# Patient Record
Sex: Male | Born: 1945 | Race: White | Hispanic: Refuse to answer | Marital: Married | Smoking: Current some day smoker
Health system: Southern US, Community
[De-identification: ages and names within clinical notes are randomized; demographics above are authoritative.]

---

## 2005-03-26 ENCOUNTER — Ambulatory Visit: Payer: Self-pay | Admitting: Physician Assistant

## 2005-04-14 ENCOUNTER — Other Ambulatory Visit: Payer: Self-pay

## 2005-04-16 ENCOUNTER — Ambulatory Visit: Payer: Self-pay | Admitting: Unknown Physician Specialty

## 2005-12-24 ENCOUNTER — Ambulatory Visit: Payer: Self-pay | Admitting: Urology

## 2006-02-17 ENCOUNTER — Ambulatory Visit: Payer: Self-pay | Admitting: Urology

## 2006-03-04 ENCOUNTER — Ambulatory Visit: Payer: Self-pay | Admitting: Gastroenterology

## 2007-02-02 ENCOUNTER — Ambulatory Visit: Payer: Self-pay | Admitting: Urology

## 2008-12-20 ENCOUNTER — Inpatient Hospital Stay: Payer: Self-pay | Admitting: Internal Medicine

## 2008-12-23 ENCOUNTER — Inpatient Hospital Stay: Payer: Self-pay | Admitting: Vascular Surgery

## 2009-01-09 ENCOUNTER — Encounter: Payer: Self-pay | Admitting: Internal Medicine

## 2009-02-04 ENCOUNTER — Ambulatory Visit: Payer: Self-pay | Admitting: Family Medicine

## 2009-02-12 ENCOUNTER — Inpatient Hospital Stay: Payer: Self-pay | Admitting: Surgery

## 2009-02-28 ENCOUNTER — Other Ambulatory Visit: Payer: Self-pay | Admitting: Internal Medicine

## 2009-03-05 ENCOUNTER — Inpatient Hospital Stay: Payer: Self-pay | Admitting: Internal Medicine

## 2009-04-17 ENCOUNTER — Ambulatory Visit: Payer: Self-pay | Admitting: Vascular Surgery

## 2009-04-18 ENCOUNTER — Ambulatory Visit: Payer: Self-pay | Admitting: Vascular Surgery

## 2009-04-26 ENCOUNTER — Ambulatory Visit: Payer: Self-pay | Admitting: Vascular Surgery

## 2009-05-07 ENCOUNTER — Ambulatory Visit: Payer: Self-pay | Admitting: Vascular Surgery

## 2009-05-15 ENCOUNTER — Inpatient Hospital Stay: Payer: Self-pay | Admitting: Vascular Surgery

## 2010-07-07 ENCOUNTER — Inpatient Hospital Stay: Payer: Self-pay | Admitting: Internal Medicine

## 2010-07-10 ENCOUNTER — Inpatient Hospital Stay: Payer: Self-pay | Admitting: Vascular Surgery

## 2010-10-09 ENCOUNTER — Ambulatory Visit: Payer: Self-pay

## 2010-10-13 ENCOUNTER — Other Ambulatory Visit: Payer: Self-pay | Admitting: Surgery

## 2010-10-16 ENCOUNTER — Inpatient Hospital Stay: Payer: Self-pay | Admitting: Surgery

## 2012-06-10 IMAGING — CR DG CHEST 2V
1 series · 2 of 2 positions shown · non-contrast
Comparison: none

REASON FOR EXAM: fever and cough
COMMENTS:

[Series 1: view not recorded · 0.17mm/px · 2 of 2 slices shown]
[im 1/2]
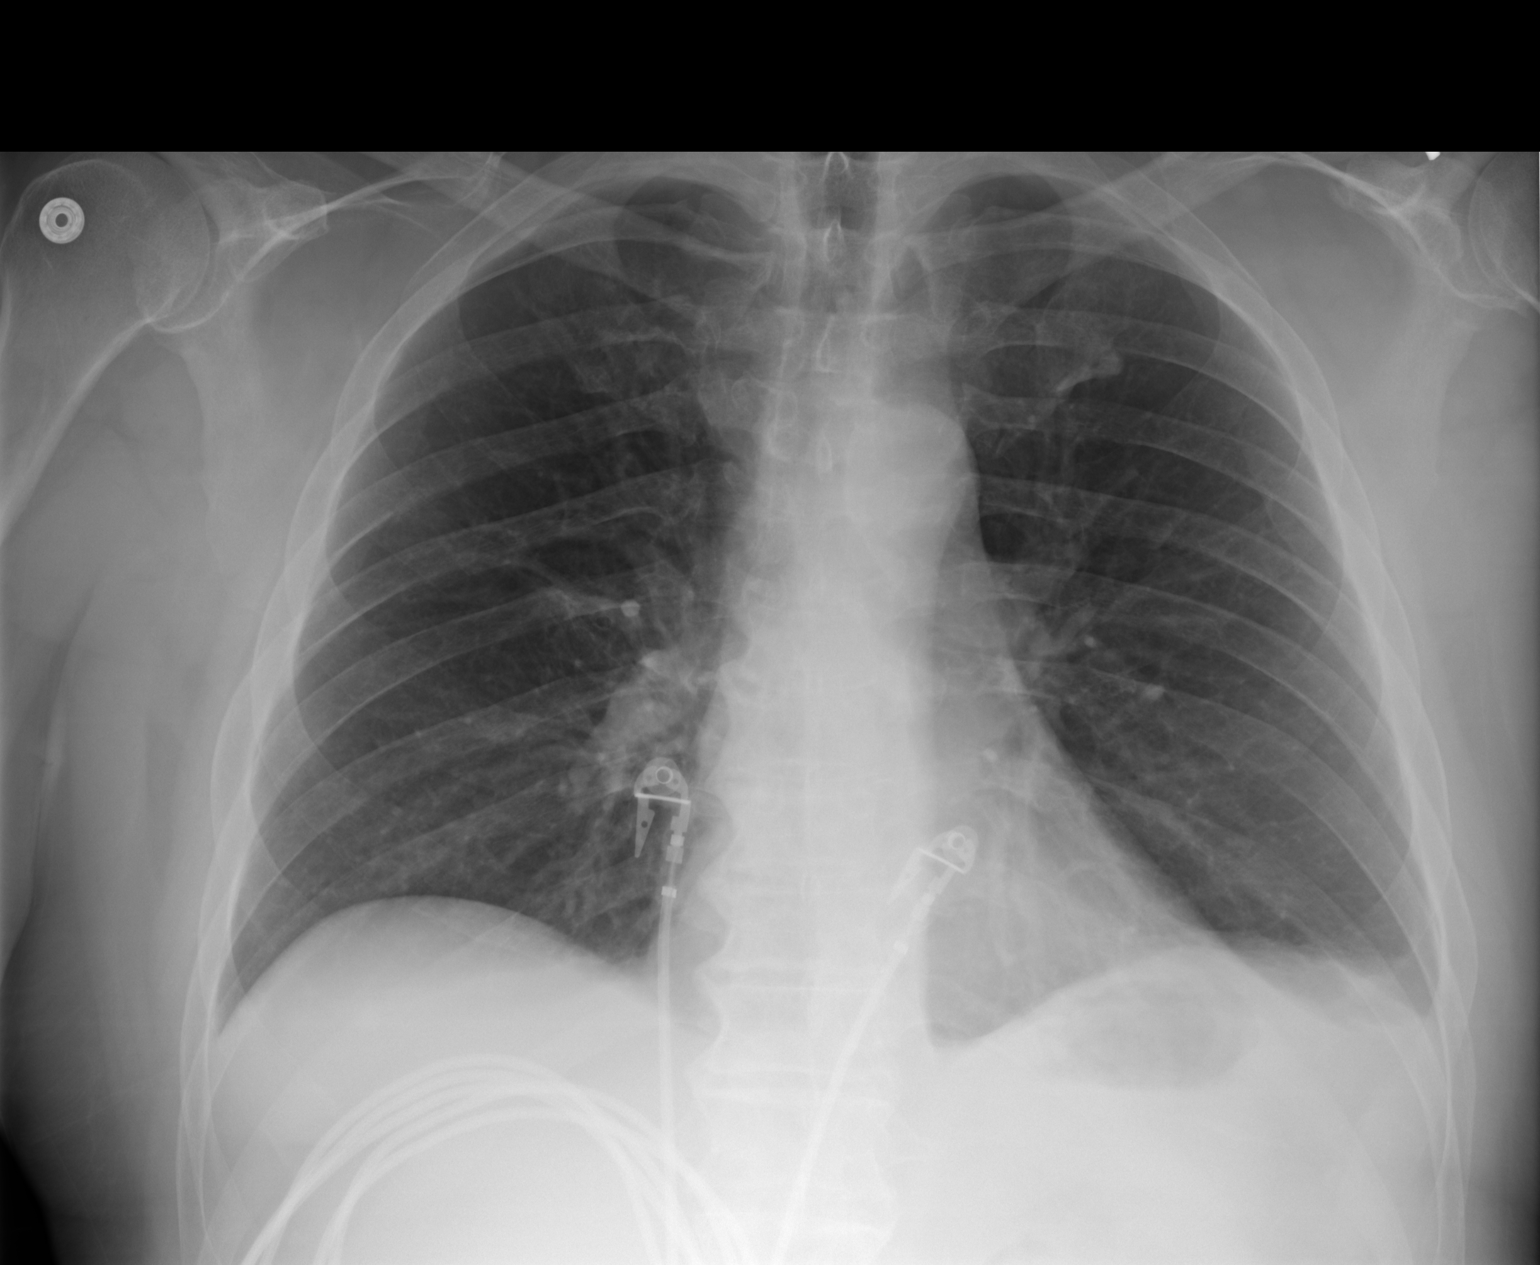
[im 2/2]
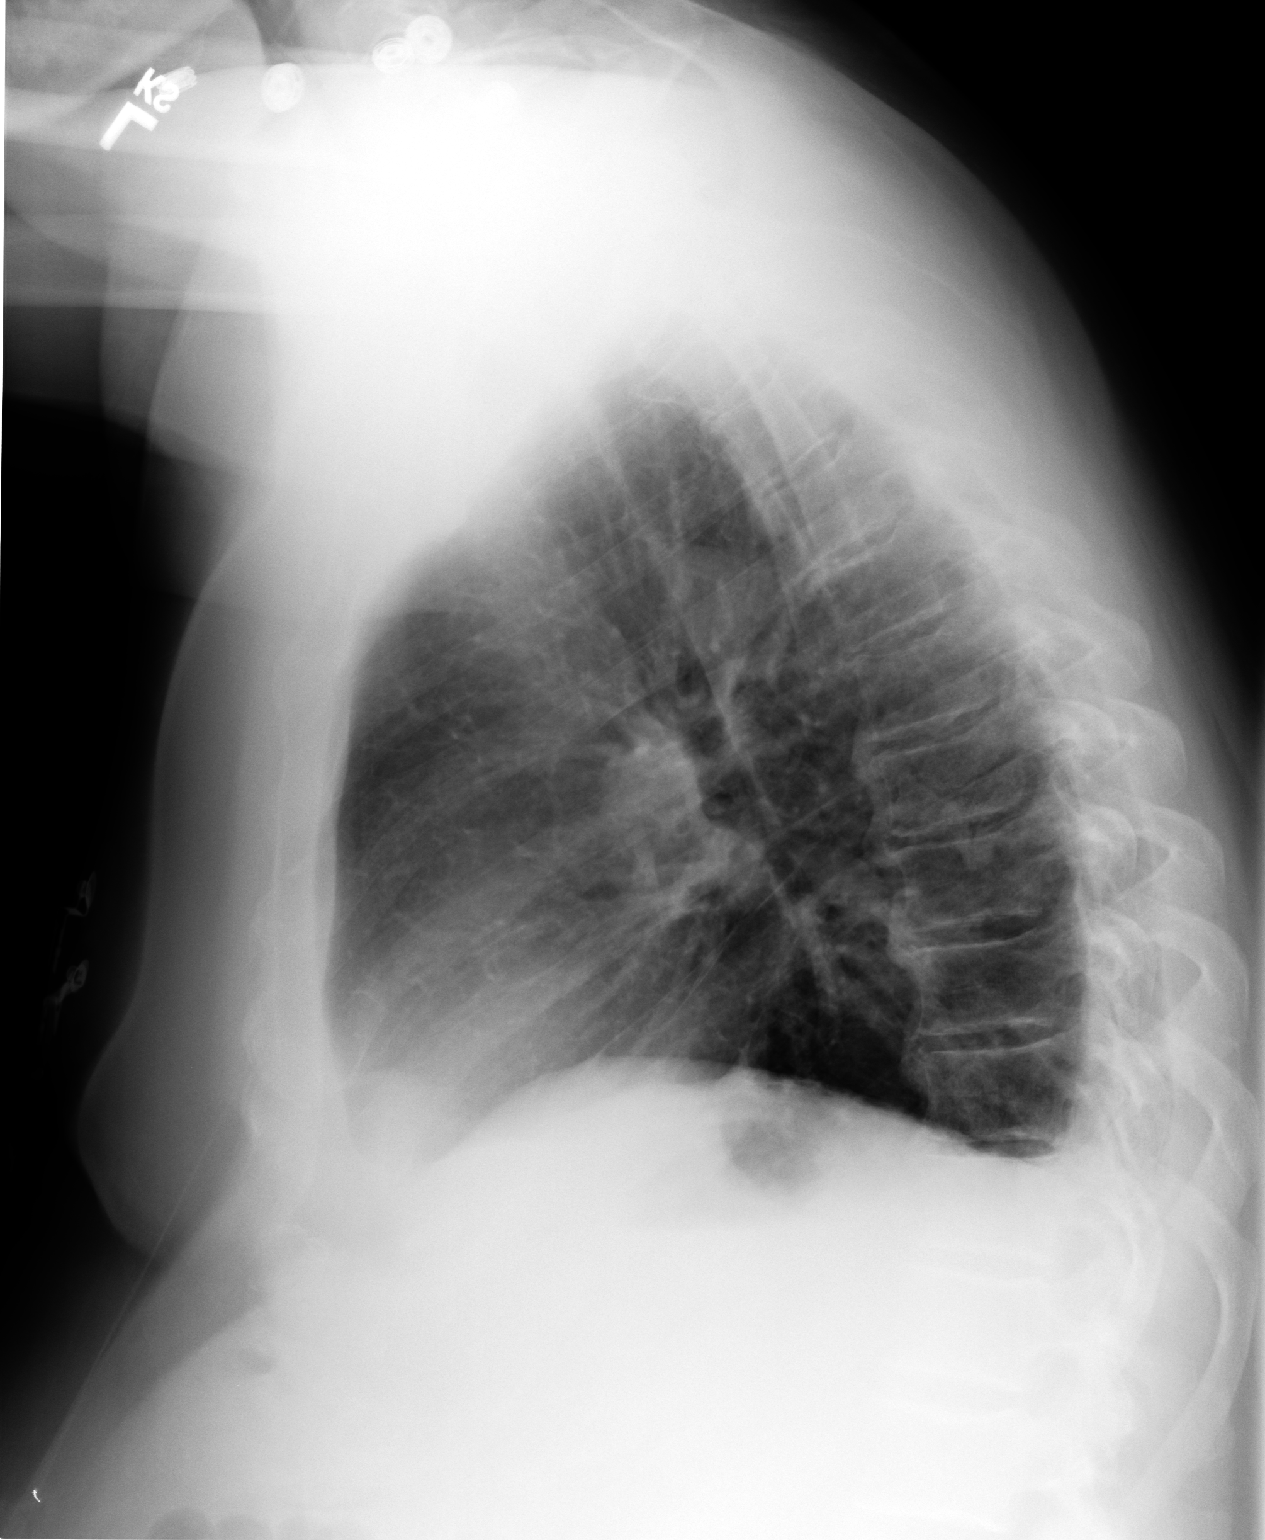

[2 of 2 positions shown; findings below may reference images not displayed]

PROCEDURE:     DXR - DXR CHEST PA (OR AP) AND LATERAL  - July 13, 2010 [DATE]

RESULT:     Comparison is made to a prior study dated 07/10/2010.

An ill-defined area of increased density projects within the right lower
lobe region. No focal regions of consolidation are identified. There is
blunting of the left costophrenic angle. The cardiac silhouette and
visualized bony skeleton are unremarkable.
IMPRESSION: 1.     Atelectasis versus infiltrate, right lower lobe.
2.     Small, left effusion.

## 2022-06-27 ENCOUNTER — Emergency Department (HOSPITAL_COMMUNITY): Payer: Medicare PPO

## 2022-06-27 ENCOUNTER — Other Ambulatory Visit: Payer: Self-pay

## 2022-06-27 ENCOUNTER — Inpatient Hospital Stay (HOSPITAL_COMMUNITY)
Admission: EM | Admit: 2022-06-27 | Discharge: 2022-06-30 | DRG: 871 | Disposition: A | Discharge status: Home / Self Care | Payer: Medicare PPO | Attending: Internal Medicine | Admitting: Internal Medicine

## 2022-06-27 ENCOUNTER — Encounter (HOSPITAL_COMMUNITY): Payer: Self-pay

## 2022-06-27 DIAGNOSIS — T380X5A Adverse effect of glucocorticoids and synthetic analogues, initial encounter: Secondary | ICD-10-CM | POA: Diagnosis present

## 2022-06-27 DIAGNOSIS — F17210 Nicotine dependence, cigarettes, uncomplicated: Secondary | ICD-10-CM | POA: Diagnosis present

## 2022-06-27 DIAGNOSIS — A419 Sepsis, unspecified organism: Principal | ICD-10-CM | POA: Diagnosis present

## 2022-06-27 DIAGNOSIS — I10 Essential (primary) hypertension: Secondary | ICD-10-CM | POA: Diagnosis present

## 2022-06-27 DIAGNOSIS — I5042 Chronic combined systolic (congestive) and diastolic (congestive) heart failure: Secondary | ICD-10-CM | POA: Diagnosis present

## 2022-06-27 DIAGNOSIS — Z20822 Contact with and (suspected) exposure to covid-19: Secondary | ICD-10-CM | POA: Diagnosis present

## 2022-06-27 DIAGNOSIS — N1832 Chronic kidney disease, stage 3b: Secondary | ICD-10-CM | POA: Diagnosis present

## 2022-06-27 DIAGNOSIS — J189 Pneumonia, unspecified organism: Secondary | ICD-10-CM | POA: Diagnosis present

## 2022-06-27 DIAGNOSIS — Z955 Presence of coronary angioplasty implant and graft: Secondary | ICD-10-CM

## 2022-06-27 DIAGNOSIS — R7989 Other specified abnormal findings of blood chemistry: Secondary | ICD-10-CM | POA: Diagnosis present

## 2022-06-27 DIAGNOSIS — I480 Paroxysmal atrial fibrillation: Secondary | ICD-10-CM | POA: Diagnosis present

## 2022-06-27 DIAGNOSIS — E118 Type 2 diabetes mellitus with unspecified complications: Secondary | ICD-10-CM | POA: Diagnosis present

## 2022-06-27 DIAGNOSIS — Z720 Tobacco use: Secondary | ICD-10-CM | POA: Diagnosis present

## 2022-06-27 DIAGNOSIS — R6520 Severe sepsis without septic shock: Secondary | ICD-10-CM | POA: Diagnosis present

## 2022-06-27 DIAGNOSIS — E1122 Type 2 diabetes mellitus with diabetic chronic kidney disease: Secondary | ICD-10-CM | POA: Diagnosis present

## 2022-06-27 DIAGNOSIS — I130 Hypertensive heart and chronic kidney disease with heart failure and stage 1 through stage 4 chronic kidney disease, or unspecified chronic kidney disease: Secondary | ICD-10-CM | POA: Diagnosis present

## 2022-06-27 DIAGNOSIS — J9601 Acute respiratory failure with hypoxia: Secondary | ICD-10-CM | POA: Diagnosis not present

## 2022-06-27 DIAGNOSIS — J440 Chronic obstructive pulmonary disease with acute lower respiratory infection: Secondary | ICD-10-CM | POA: Diagnosis present

## 2022-06-27 DIAGNOSIS — J441 Chronic obstructive pulmonary disease with (acute) exacerbation: Secondary | ICD-10-CM | POA: Diagnosis present

## 2022-06-27 DIAGNOSIS — N183 Chronic kidney disease, stage 3 unspecified: Secondary | ICD-10-CM | POA: Diagnosis present

## 2022-06-27 DIAGNOSIS — I2489 Other forms of acute ischemic heart disease: Secondary | ICD-10-CM | POA: Diagnosis present

## 2022-06-27 DIAGNOSIS — E785 Hyperlipidemia, unspecified: Secondary | ICD-10-CM | POA: Diagnosis present

## 2022-06-27 DIAGNOSIS — I252 Old myocardial infarction: Secondary | ICD-10-CM

## 2022-06-27 DIAGNOSIS — E1165 Type 2 diabetes mellitus with hyperglycemia: Secondary | ICD-10-CM | POA: Diagnosis present

## 2022-06-27 DIAGNOSIS — G4733 Obstructive sleep apnea (adult) (pediatric): Secondary | ICD-10-CM | POA: Diagnosis present

## 2022-06-27 DIAGNOSIS — I493 Ventricular premature depolarization: Secondary | ICD-10-CM | POA: Diagnosis present

## 2022-06-27 DIAGNOSIS — I2510 Atherosclerotic heart disease of native coronary artery without angina pectoris: Secondary | ICD-10-CM | POA: Diagnosis present

## 2022-06-27 DIAGNOSIS — Z7901 Long term (current) use of anticoagulants: Secondary | ICD-10-CM

## 2022-06-27 LAB — LACTIC ACID, PLASMA: Lactic Acid, Venous: 1.7 mmol/L (ref 0.5–1.9)

## 2022-06-27 LAB — RESP PANEL BY RT-PCR (FLU A&B, COVID) ARPGX2
Influenza A by PCR: NEGATIVE
Influenza B by PCR: NEGATIVE
SARS Coronavirus 2 by RT PCR: NEGATIVE

## 2022-06-27 LAB — PROTIME-INR
INR: 1.7 — ABNORMAL HIGH (ref 0.8–1.2)
Prothrombin Time: 19.6 seconds — ABNORMAL HIGH (ref 11.4–15.2)

## 2022-06-27 LAB — CBC
HCT: 43.3 % (ref 39.0–52.0)
Hemoglobin: 14 g/dL (ref 13.0–17.0)
MCH: 32.8 pg (ref 26.0–34.0)
MCHC: 32.3 g/dL (ref 30.0–36.0)
MCV: 101.4 fL — ABNORMAL HIGH (ref 80.0–100.0)
Platelets: 249 10*3/uL (ref 150–400)
RBC: 4.27 MIL/uL (ref 4.22–5.81)
RDW: 12.5 % (ref 11.5–15.5)
WBC: 13.1 10*3/uL — ABNORMAL HIGH (ref 4.0–10.5)
nRBC: 0 % (ref 0.0–0.2)

## 2022-06-27 LAB — BASIC METABOLIC PANEL
Anion gap: 12 (ref 5–15)
BUN: 36 mg/dL — ABNORMAL HIGH (ref 8–23)
CO2: 23 mmol/L (ref 22–32)
Calcium: 9 mg/dL (ref 8.9–10.3)
Chloride: 106 mmol/L (ref 98–111)
Creatinine, Ser: 1.75 mg/dL — ABNORMAL HIGH (ref 0.61–1.24)
GFR, Estimated: 40 mL/min — ABNORMAL LOW (ref >60)
Glucose, Bld: 349 mg/dL — ABNORMAL HIGH (ref 70–99)
Potassium: 4.9 mmol/L (ref 3.5–5.1)
Sodium: 141 mmol/L (ref 135–145)

## 2022-06-27 LAB — TROPONIN I (HIGH SENSITIVITY): Troponin I (High Sensitivity): 390 ng/L (ref <18)

## 2022-06-27 LAB — BRAIN NATRIURETIC PEPTIDE: B Natriuretic Peptide: 194.9 pg/mL — ABNORMAL HIGH (ref 0.0–100.0)

## 2022-06-27 MED ORDER — SODIUM CHLORIDE 0.9 % IV SOLN
500.0000 mg | Freq: Once | INTRAVENOUS | Status: AC
  Administered 2022-06-28: 500 mg via INTRAVENOUS
  Filled 2022-06-27: qty 5

## 2022-06-27 MED ORDER — IPRATROPIUM-ALBUTEROL 0.5-2.5 (3) MG/3ML IN SOLN
3.0000 mL | Freq: Once | RESPIRATORY_TRACT | Status: AC
  Administered 2022-06-27: 3 mL via RESPIRATORY_TRACT
  Filled 2022-06-27: qty 3

## 2022-06-27 MED ORDER — IOHEXOL 350 MG/ML SOLN
75.0000 mL | Freq: Once | INTRAVENOUS | Status: AC | PRN
  Administered 2022-06-27: 75 mL via INTRAVENOUS

## 2022-06-27 MED ORDER — METHYLPREDNISOLONE SODIUM SUCC 125 MG IJ SOLR
125.0000 mg | Freq: Once | INTRAMUSCULAR | Status: AC
  Administered 2022-06-27: 125 mg via INTRAVENOUS
  Filled 2022-06-27: qty 2

## 2022-06-27 MED ORDER — SODIUM CHLORIDE 0.9 % IV SOLN
1.0000 g | Freq: Once | INTRAVENOUS | Status: AC
  Administered 2022-06-27: 1 g via INTRAVENOUS
  Filled 2022-06-27: qty 10

## 2022-06-27 MED ORDER — ALBUTEROL SULFATE HFA 108 (90 BASE) MCG/ACT IN AERS: 2.0000 | INHALATION_SPRAY | RESPIRATORY_TRACT | Status: DC | PRN

## 2022-06-27 MED ORDER — ACETAMINOPHEN 500 MG PO TABS
1000.0000 mg | ORAL_TABLET | Freq: Once | ORAL | Status: AC
  Administered 2022-06-27: 1000 mg via ORAL
  Filled 2022-06-27: qty 2

## 2022-06-27 NOTE — ED Triage Notes (Addendum)
Patient arrives with labored, shallow breaths. Patient states he is short of breath and has been for a few days. Patient has a history of COPD. Reports his wife has been sick. Patient is lives in [place] and is here for a golf tournament. Takes Eliquis for Afib.

## 2022-06-27 NOTE — ED Notes (Signed)
Pt sats 85. Pt placed on 2LNC. Provider notified

## 2022-06-27 NOTE — ED Provider Triage Note (Signed)
Emergency Medicine Provider Triage Evaluation Note  Jeff White , a 76 y.o. male  was evaluated in triage.  Pt complains of breath for 4 days.  He had associated productive cough and fever.  He has been using his home inhalers without relief.  His wife was recently sick with the same thing. He does not use oxygen at home. Hx of COPD. He lives in [place] and is here for a golf tournament. He is on Eliquis for his atrial fibrillation.  Review of Systems  Positive:  Negative:   Physical Exam  BP 105/63   Pulse (!) 106   Temp (!) 100.5 F (38.1 C) (Oral)   Resp (!) 25   Ht 5\' 10"  (1.778 m)   Wt 84.4 kg   SpO2 (!) 87%   BMI 26.69 kg/m  Gen:   Awake, no distress   Resp:  Normal effort  MSK:   Moves extremities without difficulty  Other:  Wheezing/course sounds on right. On 3 L O2.  Medical Decision Making  Medically screening exam initiated at 8:58 PM.  Appropriate orders placed.  Jeff White was informed that the remainder of the evaluation will be completed by another provider, this initial triage assessment does not replace that evaluation, and the importance of remaining in the ED until their evaluation is complete.     Adolphus Birchwood, [place] 06/27/22 2100

## 2022-06-27 NOTE — ED Provider Notes (Signed)
[facility] DEPT Provider Note   CSN: [id number] Arrival date & time: 06/27/22  2027     History PMH: COPD, atrial fibrillation on thinners, CAD, DM2, HLD, Tobacco use, HTN Chief Complaint  Patient presents with   Shortness of Breath    Jeff White is a 76 y.o. male.  Pt complains of breath for 4 days.  He had associated productive cough and fever.  He has been using his home inhalers without relief.  His wife was recently sick with the same thing. He does not use oxygen at home. Hx of COPD. He lives in [place] and is here for a golf tournament. He is on Eliquis for his atrial fibrillation.   Shortness of Breath Associated symptoms: cough, fever and wheezing        Home Medications Prior to Admission medications   Not on File      Allergies    Patient has no known allergies.    Review of Systems   Review of Systems  Constitutional:  Positive for fever.  Respiratory:  Positive for cough, shortness of breath and wheezing.   All other systems reviewed and are negative.   Physical Exam Updated Vital Signs BP 108/72   Pulse 72   Temp 98.5 F (36.9 C) (Oral)   Resp (!) 27   Ht 5\' 10"  (1.778 m)   Wt 84.4 kg   SpO2 100%   BMI 26.69 kg/m  Physical Exam Vitals and nursing note reviewed.  Constitutional:      General: He is not in acute distress.    Appearance: Normal appearance. He is not ill-appearing, toxic-appearing or diaphoretic.     Interventions: He is not intubated. HENT:     Head: Normocephalic and atraumatic.     Nose: No nasal deformity.     Mouth/Throat:     Lips: Pink. No lesions.     Mouth: Mucous membranes are moist. No injury, lacerations, oral lesions or angioedema.     Pharynx: Oropharynx is clear. Uvula midline. No pharyngeal swelling, oropharyngeal exudate, posterior oropharyngeal erythema or uvula swelling.  Eyes:     General: Gaze aligned appropriately. No scleral icterus.       Right eye: No  discharge.        Left eye: No discharge.     Conjunctiva/sclera: Conjunctivae normal.     Right eye: Right conjunctiva is not injected. No exudate or hemorrhage.    Left eye: Left conjunctiva is not injected. No exudate or hemorrhage.    Pupils: Pupils are equal, round, and reactive to light.  Neck:     Vascular: No JVD.     Trachea: No tracheal deviation.  Cardiovascular:     Rate and Rhythm: Normal rate and regular rhythm.     Pulses: Normal pulses.          Radial pulses are 2+ on the right side and 2+ on the left side.       Dorsalis pedis pulses are 2+ on the right side and 2+ on the left side.     Heart sounds: Normal heart sounds, S1 normal and S2 normal. Heart sounds not distant. No murmur heard.    No friction rub. No gallop. No S3 or S4 sounds.  Pulmonary:     Effort: Pulmonary effort is normal. Tachypnea present. No accessory muscle usage or respiratory distress. He is not intubated.     Breath sounds: No stridor. Examination of the right-upper field reveals wheezing and  rhonchi. Examination of the left-upper field reveals wheezing. Examination of the right-middle field reveals wheezing and rhonchi. Examination of the left-middle field reveals wheezing. Examination of the right-lower field reveals wheezing and rhonchi. Examination of the left-lower field reveals wheezing. Wheezing and rhonchi present. No rales.  Chest:     Chest wall: No tenderness.  Abdominal:     General: Abdomen is flat. Bowel sounds are normal. There is no distension.     Palpations: Abdomen is soft. There is no mass or pulsatile mass.     Tenderness: There is no abdominal tenderness. There is no guarding or rebound.  Musculoskeletal:     Right lower leg: No tenderness. No edema.     Left lower leg: No tenderness. No edema.  Skin:    General: Skin is warm and dry.     Coloration: Skin is not jaundiced or pale.     Findings: No bruising, erythema, lesion or rash.  Neurological:     General: No focal  deficit present.     Mental Status: He is alert and oriented to person, place, and time.     GCS: GCS eye subscore is 4. GCS verbal subscore is 5. GCS motor subscore is 6.  Psychiatric:        Mood and Affect: Mood normal.        Behavior: Behavior normal. Behavior is cooperative.     ED Results / Procedures / Treatments   Labs (all labs ordered are listed, but only abnormal results are displayed) Labs Reviewed  BASIC METABOLIC PANEL - Abnormal; Notable for the following components:      Result Value   Glucose, Bld 349 (*)    BUN 36 (*)    Creatinine, Ser 1.75 (*)    GFR, Estimated 40 (*)    All other components within normal limits  CBC - Abnormal; Notable for the following components:   WBC 13.1 (*)    MCV 101.4 (*)    All other components within normal limits  PROTIME-INR - Abnormal; Notable for the following components:   Prothrombin Time 19.6 (*)    INR 1.7 (*)    All other components within normal limits  BRAIN NATRIURETIC PEPTIDE - Abnormal; Notable for the following components:   B Natriuretic Peptide 194.9 (*)    All other components within normal limits  TROPONIN I (HIGH SENSITIVITY) - Abnormal; Notable for the following components:   Troponin I (High Sensitivity) 390 (*)    All other components within normal limits  RESP PANEL BY RT-PCR (FLU A&B, COVID) ARPGX2  CULTURE, BLOOD (ROUTINE X 2)  CULTURE, BLOOD (ROUTINE X 2)  LACTIC ACID, PLASMA  LACTIC ACID, PLASMA  URINALYSIS, ROUTINE W REFLEX MICROSCOPIC  TROPONIN I (HIGH SENSITIVITY)    EKG EKG Interpretation  Date/Time:  Saturday June 27 2022 22:05:10 EDT Ventricular Rate:  89 PR Interval:  155 QRS Duration: 89 QT Interval:  362 QTC Calculation: 441 R Axis:   69 Text Interpretation: Sinus rhythm Atrial premature complexes Confirmed by Dene Gentry [phone]) on 06/27/2022 10:21:17 PM  Radiology CT Angio Chest PE W and/or Wo Contrast  Result Date: 06/27/2022 CLINICAL DATA:  Shortness of breath for  several days with fevers EXAM: CT ANGIOGRAPHY CHEST WITH CONTRAST TECHNIQUE: Multidetector CT imaging of the chest was performed using the standard protocol during bolus administration of intravenous contrast. Multiplanar CT image reconstructions and MIPs were obtained to evaluate the vascular anatomy. RADIATION DOSE REDUCTION: This exam was performed according to the departmental  dose-optimization program which includes automated exposure control, adjustment of the mA and/or kV according to patient size and/or use of iterative reconstruction technique. CONTRAST:  18mL OMNIPAQUE IOHEXOL 350 MG/ML SOLN COMPARISON:  Chest x-ray from earlier in the same day. FINDINGS: Cardiovascular: Thoracic aorta shows atherosclerotic calcifications. No aneurysmal dilatation or dissection is noted. No cardiac enlargement is seen. Coronary calcifications are noted. The pulmonary artery shows a normal branching pattern bilaterally. No filling defect to suggest pulmonary embolism is noted. Mediastinum/Nodes: Thoracic inlet is within normal limits. No hilar or mediastinal adenopathy is noted. Scattered small mediastinal nodes are seen likely reactive in nature. The esophagus is within normal limits. Lungs/Pleura: Somewhat limited by patient motion artifact. Diffuse tree-in-bud nodularity is noted throughout both lungs consistent with inflammatory change. No sizable effusion is noted. No sizable parenchymal nodules are seen. Upper Abdomen: Upper pole left renal cyst is noted which appears simple in nature. No further follow-up is recommended. Musculoskeletal: Degenerative changes of the thoracic spine are noted. Review of the MIP images confirms the above findings. IMPRESSION: Diffuse changes throughout both lungs consistent with underlying inflammatory change likely atypical pneumonia. No evidence of pulmonary emboli. Aortic Atherosclerosis (ICD10-I70.0). Electronically Signed   By: Inez Catalina M.D.   On: 06/27/2022 23:47   DG Chest 2  View  Result Date: 06/27/2022 CLINICAL DATA:  Shortness of breath EXAM: CHEST - 2 VIEW COMPARISON:  Radiographs 10/18/2010 FINDINGS: Mild bronchovascular crowding in the lung bases with bronchial wall thickening. No focal consolidation, pleural effusion, or pneumothorax. Normal cardiomediastinal silhouette. No acute osseous abnormality. IMPRESSION: Mild bronchitis.  No focal consolidation. Electronically Signed   By: Placido Sou M.D.   On: 06/27/2022 21:43    Procedures .Critical Care  Performed by: Adolphus Birchwood, PA-C Authorized by: Adolphus Birchwood, PA-C   Critical care provider statement:    Critical care time (minutes):  75   Critical care time was exclusive of:  Separately billable procedures and treating other patients   Critical care was necessary to treat or prevent imminent or life-threatening deterioration of the following conditions:  Respiratory failure   Critical care was time spent personally by me on the following activities:  Blood draw for specimens, development of treatment plan with patient or surrogate, discussions with consultants, discussions with primary provider, evaluation of patient's response to treatment, examination of patient, obtaining history from patient or surrogate, review of old charts, re-evaluation of patient's condition, pulse oximetry, ordering and review of radiographic studies, ordering and review of laboratory studies and ordering and performing treatments and interventions   Care discussed with: admitting provider     This patient was on telemetry or cardiac monitoring during their time in the ED.    Medications Ordered in ED Medications  albuterol (VENTOLIN HFA) 108 (90 Base) MCG/ACT inhaler 2 puff (has no administration in time range)  azithromycin (ZITHROMAX) 500 mg in sodium chloride 0.9 % 250 mL IVPB (has no administration in time range)  ipratropium-albuterol (DUONEB) 0.5-2.5 (3) MG/3ML nebulizer solution 3 mL (3 mLs Nebulization Given  06/27/22 2147)  methylPREDNISolone sodium succinate (SOLU-MEDROL) 125 mg/2 mL injection 125 mg (125 mg Intravenous Given 06/27/22 2221)  acetaminophen (TYLENOL) tablet 1,000 mg (1,000 mg Oral Given 06/27/22 2205)  cefTRIAXone (ROCEPHIN) 1 g in sodium chloride 0.9 % 100 mL IVPB (1 g Intravenous New Bag/Given 06/27/22 2236)  iohexol (OMNIPAQUE) 350 MG/ML injection 75 mL (75 mLs Intravenous Contrast Given 06/27/22 2248)    ED Course/ Medical Decision Making/ A&P Clinical Course as of  06/27/22 2356  Sat Jun 27, 2022  2202 Per most recent Cards note:  Mr. Rodolphe a Daeshawn Eichholz is a 76 year old gentleman, patient of Dr. Manuella Ghazi MD who used to see my colleague Dr. Buel Ream and has elected to see me He does have history of CAD status post stenting of his circumflex in 2010 after a STEMI event, no intervention since. He had last seen Dr. Buel Ream in June 2016 His risk factors include dyslipidemia COPD, diabetes and renal insufficiency In my initial visit on April 21 I recommended labs echo stress test and a monitor Labs were done on April 212021 which showed hemoglobin 16 platelet count 168 sodium 143 potassium 5.4 creatinine 1.55 GFR was 44  Echocardiogram on 23 January 2020 showed normal LV chamber and wall dimensions EF 50 to 55%, RV both atria normal normal valves Lexiscan nuclear stress test on January 25, 2020 showed old inferoapical infarct no ischemia EF 41% I sent him to Dr Michaell Cowing who did his PVC ablation in September 2021  [GL]  2204 Troponin 390  [GL]    Clinical Course User Index [GL] Sherre Poot Adora Fridge, PA-C                           Medical Decision Making Amount and/or Complexity of Data Reviewed Labs: ordered. Radiology: ordered.  Risk OTC drugs. Prescription drug management.    MDM  This is a 76 y.o. male who presents to the ED with shortness of breath, cough, and fever The differential of this patient includes but is not limited to CHF, ACS, COPD, Asthma, PNA, PE, PTX, Anxiety,  Viral PNA, and Bronchitis.  Initial Impression  He is having some increased work of breathing. Now on 4 L of O2. Course lung sounds and wheezing on the right. Slightly tachycardic, RR 25. Temperature 100.5.  He is not hypervolemic. His lung sounds are course and wheezy.  I have high suspicion for PNA with COPD exacerbation element. Blood cultures are ordered. Will go ahead and give Solumedrol, Duo neb until workup is complete. He has also been started on IV antibiotics to cover for CAP  I personally ordered, reviewed, and interpreted all laboratory work and imaging and agree with radiologist interpretation. Results interpreted below:  WBC 13.1 Creat 1.75/BUN 36 (up from baseline) Lactate 1.7 BNP 194 Troponin 390, repeat pending Negative COVID/Flu UA pending Blood cultures pending CXR with no focal consolidation, signs of mild bronchitis noted EKG is nonischemic CTA chest pending   Assessment/Plan:  Work-up is notable for a leukocytosis.  He also has a mild AKI.  Chest XR was not notable for any findings to truly explain symptoms. We did get a critical troponin level of 390.  I do not have baseline value for him and patient has NOT had any chest pain and he has a nonischemic EKG. I suspect this is demand ischemia.  He does have a history of CAD with a STEMI in 2010 and four stents. Last echo in April of 2021 showed EF of 50-44%. Consideration of heparinization once workup is complete, but plan to hold off right now since clinical picture is not indicative of ACS. In this setting, we elected to go ahead and get a CTA chest to rule out pulmonary embolism as well as occult pneumonia.   11:56 PM Care of Jeff White transferred to [facility] and Dr. Leonette Monarch at the end of my shift as the patient will require reassessment once labs/imaging have  resulted. Patient presentation, ED course, and plan of care discussed with review of all pertinent labs and imaging. Please see his/her note for further  details regarding further ED course and disposition. Plan at time of handoff is f/u on CTA imaging and admit to hospital. This may be altered or completely changed at the discretion of the oncoming team pending results of further workup.    Charting Requirements Additional history is obtained from:  Independent historian External Records from outside source obtained and reviewed including: Recent Cardiology note, prior Creatinine Social Determinants of Health:   From [place] [employer] PMH that complicates patient's illness: CAD, on blood thinners  Patient Care Problems that were addressed during this visit: - Acute hypoxic respiratory failure: Acute illness with systemic symptoms - Elevated Troponin: Acute illness with complication This patient was maintained on a cardiac monitor/telemetry. I personally viewed and interpreted the cardiac monitor which reveals an underlying rhythm of NSR/ST Medications given in ED: Solu Medrol, Duo neb, tylenol, Rocephin, Zithromax Reevaluation of the patient after these medicines showed that the patient improved I have reviewed home medications and made changes accordingly.  Critical Care Interventions: Acute hypoxic respiratory failure Consultations: see oncoming provider note Disposition: anticipate admission  This is a shared visit with my attending physician, Dr. Francia Greaves.  We have discussed this patient and they have independently evaluated this patient. The plan was altered or changed as needed.  Portions of this note were generated with [employer]. Dictation errors may occur despite best attempts at proofreading.   Final Clinical Impression(s) / ED Diagnoses Final diagnoses:  Acute respiratory failure with hypoxia [facility])    Rx / DC Orders ED Discharge Orders     None         Adolphus Birchwood, PA-C 06/27/22 2356    Valarie Merino, MD 07/01/22 [phone]

## 2022-06-28 ENCOUNTER — Inpatient Hospital Stay (HOSPITAL_COMMUNITY): Payer: Medicare PPO

## 2022-06-28 ENCOUNTER — Encounter (HOSPITAL_COMMUNITY): Payer: Self-pay | Admitting: Internal Medicine

## 2022-06-28 DIAGNOSIS — I5042 Chronic combined systolic (congestive) and diastolic (congestive) heart failure: Secondary | ICD-10-CM | POA: Diagnosis present

## 2022-06-28 DIAGNOSIS — J189 Pneumonia, unspecified organism: Secondary | ICD-10-CM | POA: Diagnosis present

## 2022-06-28 DIAGNOSIS — E785 Hyperlipidemia, unspecified: Secondary | ICD-10-CM | POA: Diagnosis present

## 2022-06-28 DIAGNOSIS — E118 Type 2 diabetes mellitus with unspecified complications: Secondary | ICD-10-CM | POA: Diagnosis present

## 2022-06-28 DIAGNOSIS — I252 Old myocardial infarction: Secondary | ICD-10-CM | POA: Diagnosis not present

## 2022-06-28 DIAGNOSIS — N1832 Chronic kidney disease, stage 3b: Secondary | ICD-10-CM | POA: Diagnosis present

## 2022-06-28 DIAGNOSIS — F17210 Nicotine dependence, cigarettes, uncomplicated: Secondary | ICD-10-CM

## 2022-06-28 DIAGNOSIS — I2489 Other forms of acute ischemic heart disease: Secondary | ICD-10-CM | POA: Diagnosis present

## 2022-06-28 DIAGNOSIS — Z20822 Contact with and (suspected) exposure to covid-19: Secondary | ICD-10-CM | POA: Diagnosis present

## 2022-06-28 DIAGNOSIS — A419 Sepsis, unspecified organism: Secondary | ICD-10-CM | POA: Diagnosis present

## 2022-06-28 DIAGNOSIS — I10 Essential (primary) hypertension: Secondary | ICD-10-CM | POA: Diagnosis present

## 2022-06-28 DIAGNOSIS — J440 Chronic obstructive pulmonary disease with acute lower respiratory infection: Secondary | ICD-10-CM | POA: Diagnosis present

## 2022-06-28 DIAGNOSIS — Z720 Tobacco use: Secondary | ICD-10-CM | POA: Diagnosis not present

## 2022-06-28 DIAGNOSIS — I480 Paroxysmal atrial fibrillation: Secondary | ICD-10-CM | POA: Diagnosis present

## 2022-06-28 DIAGNOSIS — E1122 Type 2 diabetes mellitus with diabetic chronic kidney disease: Secondary | ICD-10-CM | POA: Diagnosis present

## 2022-06-28 DIAGNOSIS — N1831 Chronic kidney disease, stage 3a: Secondary | ICD-10-CM

## 2022-06-28 DIAGNOSIS — T380X5A Adverse effect of glucocorticoids and synthetic analogues, initial encounter: Secondary | ICD-10-CM | POA: Diagnosis present

## 2022-06-28 DIAGNOSIS — J9601 Acute respiratory failure with hypoxia: Principal | ICD-10-CM | POA: Diagnosis present

## 2022-06-28 DIAGNOSIS — Z7901 Long term (current) use of anticoagulants: Secondary | ICD-10-CM | POA: Diagnosis not present

## 2022-06-28 DIAGNOSIS — R6520 Severe sepsis without septic shock: Secondary | ICD-10-CM | POA: Diagnosis present

## 2022-06-28 DIAGNOSIS — N183 Chronic kidney disease, stage 3 unspecified: Secondary | ICD-10-CM | POA: Diagnosis present

## 2022-06-28 DIAGNOSIS — R7989 Other specified abnormal findings of blood chemistry: Secondary | ICD-10-CM | POA: Diagnosis present

## 2022-06-28 DIAGNOSIS — I493 Ventricular premature depolarization: Secondary | ICD-10-CM | POA: Diagnosis present

## 2022-06-28 DIAGNOSIS — R778 Other specified abnormalities of plasma proteins: Secondary | ICD-10-CM

## 2022-06-28 DIAGNOSIS — G4733 Obstructive sleep apnea (adult) (pediatric): Secondary | ICD-10-CM | POA: Diagnosis present

## 2022-06-28 DIAGNOSIS — Z955 Presence of coronary angioplasty implant and graft: Secondary | ICD-10-CM | POA: Diagnosis not present

## 2022-06-28 DIAGNOSIS — J441 Chronic obstructive pulmonary disease with (acute) exacerbation: Secondary | ICD-10-CM | POA: Diagnosis present

## 2022-06-28 DIAGNOSIS — I130 Hypertensive heart and chronic kidney disease with heart failure and stage 1 through stage 4 chronic kidney disease, or unspecified chronic kidney disease: Secondary | ICD-10-CM | POA: Diagnosis present

## 2022-06-28 DIAGNOSIS — I2510 Atherosclerotic heart disease of native coronary artery without angina pectoris: Secondary | ICD-10-CM | POA: Diagnosis present

## 2022-06-28 DIAGNOSIS — E1165 Type 2 diabetes mellitus with hyperglycemia: Secondary | ICD-10-CM | POA: Diagnosis present

## 2022-06-28 LAB — CBC WITH DIFFERENTIAL/PLATELET
Abs Immature Granulocytes: 0.08 10*3/uL — ABNORMAL HIGH (ref 0.00–0.07)
Basophils Absolute: 0 10*3/uL (ref 0.0–0.1)
Basophils Relative: 0 %
Eosinophils Absolute: 0 10*3/uL (ref 0.0–0.5)
Eosinophils Relative: 0 %
HCT: 41 % (ref 39.0–52.0)
Hemoglobin: 13.1 g/dL (ref 13.0–17.0)
Immature Granulocytes: 1 %
Lymphocytes Relative: 5 %
Lymphs Abs: 0.7 10*3/uL (ref 0.7–4.0)
MCH: 32.7 pg (ref 26.0–34.0)
MCHC: 32 g/dL (ref 30.0–36.0)
MCV: 102.2 fL — ABNORMAL HIGH (ref 80.0–100.0)
Monocytes Absolute: 0.7 10*3/uL (ref 0.1–1.0)
Monocytes Relative: 6 %
Neutro Abs: 11.8 10*3/uL — ABNORMAL HIGH (ref 1.7–7.7)
Neutrophils Relative %: 88 %
Platelets: 228 10*3/uL (ref 150–400)
RBC: 4.01 MIL/uL — ABNORMAL LOW (ref 4.22–5.81)
RDW: 12.5 % (ref 11.5–15.5)
WBC: 13.3 10*3/uL — ABNORMAL HIGH (ref 4.0–10.5)
nRBC: 0 % (ref 0.0–0.2)

## 2022-06-28 LAB — URINALYSIS, COMPLETE (UACMP) WITH MICROSCOPIC
Bacteria, UA: NONE SEEN
Bilirubin Urine: NEGATIVE
Glucose, UA: 50 mg/dL — AB
Ketones, ur: NEGATIVE mg/dL
Leukocytes,Ua: NEGATIVE
Nitrite: NEGATIVE
Protein, ur: >=300 mg/dL — AB
Specific Gravity, Urine: >1.046 — ABNORMAL HIGH (ref 1.005–1.030)
pH: 5 (ref 5.0–8.0)

## 2022-06-28 LAB — HEPATIC FUNCTION PANEL
ALT: 49 U/L — ABNORMAL HIGH (ref 0–44)
AST: 29 U/L (ref 15–41)
Albumin: 3.4 g/dL — ABNORMAL LOW (ref 3.5–5.0)
Alkaline Phosphatase: 90 U/L (ref 38–126)
Bilirubin, Direct: 0.2 mg/dL (ref 0.0–0.2)
Indirect Bilirubin: 0.5 mg/dL (ref 0.3–0.9)
Total Bilirubin: 0.7 mg/dL (ref 0.3–1.2)
Total Protein: 7.1 g/dL (ref 6.5–8.1)

## 2022-06-28 LAB — TROPONIN I (HIGH SENSITIVITY)
Troponin I (High Sensitivity): 119 ng/L (ref <18)
Troponin I (High Sensitivity): 134 ng/L (ref <18)
Troponin I (High Sensitivity): 337 ng/L (ref <18)

## 2022-06-28 LAB — SODIUM, URINE, RANDOM: Sodium, Ur: 25 mmol/L

## 2022-06-28 LAB — RESPIRATORY PANEL BY PCR

## 2022-06-28 LAB — COMPREHENSIVE METABOLIC PANEL
ALT: 50 U/L — ABNORMAL HIGH (ref 0–44)
AST: 28 U/L (ref 15–41)
Albumin: 3.4 g/dL — ABNORMAL LOW (ref 3.5–5.0)
Alkaline Phosphatase: 92 U/L (ref 38–126)
Anion gap: 8 (ref 5–15)
BUN: 35 mg/dL — ABNORMAL HIGH (ref 8–23)
CO2: 25 mmol/L (ref 22–32)
Calcium: 8.5 mg/dL — ABNORMAL LOW (ref 8.9–10.3)
Chloride: 108 mmol/L (ref 98–111)
Creatinine, Ser: 1.62 mg/dL — ABNORMAL HIGH (ref 0.61–1.24)
GFR, Estimated: 44 mL/min — ABNORMAL LOW (ref >60)
Glucose, Bld: 335 mg/dL — ABNORMAL HIGH (ref 70–99)
Potassium: 5.1 mmol/L (ref 3.5–5.1)
Sodium: 141 mmol/L (ref 135–145)
Total Bilirubin: 0.5 mg/dL (ref 0.3–1.2)
Total Protein: 7.1 g/dL (ref 6.5–8.1)

## 2022-06-28 LAB — BASIC METABOLIC PANEL
Anion gap: 6 (ref 5–15)
BUN: 36 mg/dL — ABNORMAL HIGH (ref 8–23)
CO2: 27 mmol/L (ref 22–32)
Calcium: 8.6 mg/dL — ABNORMAL LOW (ref 8.9–10.3)
Chloride: 107 mmol/L (ref 98–111)
Creatinine, Ser: 1.61 mg/dL — ABNORMAL HIGH (ref 0.61–1.24)
GFR, Estimated: 45 mL/min — ABNORMAL LOW (ref >60)
Glucose, Bld: 337 mg/dL — ABNORMAL HIGH (ref 70–99)
Potassium: 5.2 mmol/L — ABNORMAL HIGH (ref 3.5–5.1)
Sodium: 140 mmol/L (ref 135–145)

## 2022-06-28 LAB — ECHOCARDIOGRAM COMPLETE
AR max vel: 3.49 cm2
AV Peak grad: 5.9 mmHg
Ao pk vel: 1.21 m/s
Height: 70 in
S' Lateral: 4.4 cm
Weight: 3006.4 oz

## 2022-06-28 LAB — APTT: aPTT: 36 seconds (ref 24–36)

## 2022-06-28 LAB — CREATININE, URINE, RANDOM: Creatinine, Urine: 130 mg/dL

## 2022-06-28 LAB — LACTIC ACID, PLASMA: Lactic Acid, Venous: 1.4 mmol/L (ref 0.5–1.9)

## 2022-06-28 LAB — PROCALCITONIN: Procalcitonin: <0.1 ng/mL

## 2022-06-28 LAB — HIV ANTIBODY (ROUTINE TESTING W REFLEX): HIV Screen 4th Generation wRfx: NONREACTIVE

## 2022-06-28 LAB — CK: Total CK: 110 U/L (ref 49–397)

## 2022-06-28 LAB — PHOSPHORUS: Phosphorus: 4.2 mg/dL (ref 2.5–4.6)

## 2022-06-28 LAB — GLUCOSE, CAPILLARY
Glucose-Capillary: 321 mg/dL — ABNORMAL HIGH (ref 70–99)
Glucose-Capillary: 269 mg/dL — ABNORMAL HIGH (ref 70–99)
Glucose-Capillary: 243 mg/dL — ABNORMAL HIGH (ref 70–99)
Glucose-Capillary: 322 mg/dL — ABNORMAL HIGH (ref 70–99)

## 2022-06-28 LAB — LIPID PANEL
Cholesterol: 78 mg/dL (ref 0–200)
HDL: 26 mg/dL — ABNORMAL LOW (ref >40)
LDL Cholesterol: 42 mg/dL (ref 0–99)
Total CHOL/HDL Ratio: 3 RATIO
Triglycerides: 52 mg/dL (ref <150)
VLDL: 10 mg/dL (ref 0–40)

## 2022-06-28 LAB — TSH: TSH: 0.724 u[IU]/mL (ref 0.350–4.500)

## 2022-06-28 LAB — MRSA NEXT GEN BY PCR, NASAL: MRSA by PCR Next Gen: NOT DETECTED

## 2022-06-28 LAB — MAGNESIUM: Magnesium: 2.1 mg/dL (ref 1.7–2.4)

## 2022-06-28 LAB — CBG MONITORING, ED: Glucose-Capillary: 252 mg/dL — ABNORMAL HIGH (ref 70–99)

## 2022-06-28 LAB — HEMOGLOBIN A1C
Hgb A1c MFr Bld: 7 % — ABNORMAL HIGH (ref 4.8–5.6)
Mean Plasma Glucose: 154.2 mg/dL

## 2022-06-28 LAB — PREALBUMIN: Prealbumin: 8 mg/dL — ABNORMAL LOW (ref 18–38)

## 2022-06-28 MED ORDER — IPRATROPIUM-ALBUTEROL 0.5-2.5 (3) MG/3ML IN SOLN
3.0000 mL | Freq: Three times a day (TID) | RESPIRATORY_TRACT | Status: DC
  Administered 2022-06-28 – 2022-06-30 (×8): 3 mL via RESPIRATORY_TRACT
  Filled 2022-06-28 (×7): qty 3

## 2022-06-28 MED ORDER — HYDROCODONE-ACETAMINOPHEN 5-325 MG PO TABS: 1.0000 | ORAL_TABLET | ORAL | Status: DC | PRN

## 2022-06-28 MED ORDER — APIXABAN 5 MG PO TABS
5.0000 mg | ORAL_TABLET | Freq: Two times a day (BID) | ORAL | Status: DC
  Administered 2022-06-28 – 2022-06-30 (×6): 5 mg via ORAL
  Filled 2022-06-28 (×6): qty 1

## 2022-06-28 MED ORDER — ASPIRIN 81 MG PO TBEC
81.0000 mg | DELAYED_RELEASE_TABLET | Freq: Every day | ORAL | Status: DC
  Administered 2022-06-28 – 2022-06-30 (×3): 81 mg via ORAL
  Filled 2022-06-28 (×3): qty 1

## 2022-06-28 MED ORDER — ALBUTEROL SULFATE (2.5 MG/3ML) 0.083% IN NEBU
2.5000 mg | INHALATION_SOLUTION | RESPIRATORY_TRACT | Status: DC | PRN
  Administered 2022-06-28: 2.5 mg via RESPIRATORY_TRACT
  Filled 2022-06-28: qty 3

## 2022-06-28 MED ORDER — NICOTINE 14 MG/24HR TD PT24
14.0000 mg | MEDICATED_PATCH | Freq: Every day | TRANSDERMAL | Status: DC
  Administered 2022-06-28 – 2022-06-30 (×3): 14 mg via TRANSDERMAL
  Filled 2022-06-28 (×3): qty 1

## 2022-06-28 MED ORDER — ATORVASTATIN CALCIUM 20 MG PO TABS
20.0000 mg | ORAL_TABLET | Freq: Every day | ORAL | Status: DC
  Administered 2022-06-28 – 2022-06-30 (×3): 20 mg via ORAL
  Filled 2022-06-28 (×3): qty 1

## 2022-06-28 MED ORDER — INSULIN ASPART 100 UNIT/ML IJ SOLN
0.0000 [IU] | INTRAMUSCULAR | Status: DC
  Administered 2022-06-28: 5 [IU] via SUBCUTANEOUS
  Filled 2022-06-28: qty 0.09

## 2022-06-28 MED ORDER — ORAL CARE MOUTH RINSE: 15.0000 mL | OROMUCOSAL | Status: DC | PRN

## 2022-06-28 MED ORDER — ACETAMINOPHEN 325 MG PO TABS: 650.0000 mg | ORAL_TABLET | Freq: Four times a day (QID) | ORAL | Status: DC | PRN

## 2022-06-28 MED ORDER — PREDNISONE 20 MG PO TABS
40.0000 mg | ORAL_TABLET | Freq: Every day | ORAL | Status: DC
  Administered 2022-06-29 – 2022-06-30 (×2): 40 mg via ORAL
  Filled 2022-06-28 (×2): qty 2

## 2022-06-28 MED ORDER — INSULIN ASPART 100 UNIT/ML IJ SOLN
0.0000 [IU] | Freq: Three times a day (TID) | INTRAMUSCULAR | Status: DC
  Administered 2022-06-28: 11 [IU] via SUBCUTANEOUS
  Administered 2022-06-28: 8 [IU] via SUBCUTANEOUS
  Administered 2022-06-28: 5 [IU] via SUBCUTANEOUS
  Administered 2022-06-29: 8 [IU] via SUBCUTANEOUS
  Administered 2022-06-29: 5 [IU] via SUBCUTANEOUS
  Administered 2022-06-29: 8 [IU] via SUBCUTANEOUS
  Administered 2022-06-30: 5 [IU] via SUBCUTANEOUS
  Filled 2022-06-28: qty 0.15

## 2022-06-28 MED ORDER — PERFLUTREN LIPID MICROSPHERE
1.0000 mL | INTRAVENOUS | Status: AC | PRN
  Administered 2022-06-28: 6 mL via INTRAVENOUS

## 2022-06-28 MED ORDER — SODIUM CHLORIDE 0.9 % IV SOLN: INTRAVENOUS | Status: DC

## 2022-06-28 MED ORDER — PANTOPRAZOLE SODIUM 40 MG PO TBEC
40.0000 mg | DELAYED_RELEASE_TABLET | Freq: Every day | ORAL | Status: DC
  Administered 2022-06-28 – 2022-06-30 (×3): 40 mg via ORAL
  Filled 2022-06-28 (×3): qty 1

## 2022-06-28 MED ORDER — GUAIFENESIN ER 600 MG PO TB12
600.0000 mg | ORAL_TABLET | Freq: Two times a day (BID) | ORAL | Status: DC
  Administered 2022-06-28 – 2022-06-30 (×6): 600 mg via ORAL
  Filled 2022-06-28 (×6): qty 1

## 2022-06-28 MED ORDER — SODIUM CHLORIDE 0.9 % IV SOLN
500.0000 mg | INTRAVENOUS | Status: DC
  Administered 2022-06-28: 500 mg via INTRAVENOUS
  Filled 2022-06-28: qty 5

## 2022-06-28 MED ORDER — CARVEDILOL 3.125 MG PO TABS
3.1250 mg | ORAL_TABLET | Freq: Two times a day (BID) | ORAL | Status: DC
  Administered 2022-06-28 – 2022-06-30 (×4): 3.125 mg via ORAL
  Filled 2022-06-28 (×4): qty 1

## 2022-06-28 MED ORDER — BUDESONIDE 0.25 MG/2ML IN SUSP
0.2500 mg | Freq: Two times a day (BID) | RESPIRATORY_TRACT | Status: DC
  Administered 2022-06-28 – 2022-06-30 (×5): 0.25 mg via RESPIRATORY_TRACT
  Filled 2022-06-28 (×5): qty 2

## 2022-06-28 MED ORDER — SODIUM CHLORIDE 0.9 % IV SOLN
2.0000 g | INTRAVENOUS | Status: DC
  Administered 2022-06-28: 2 g via INTRAVENOUS
  Filled 2022-06-28: qty 20

## 2022-06-28 MED ORDER — ACETAMINOPHEN 650 MG RE SUPP: 650.0000 mg | Freq: Four times a day (QID) | RECTAL | Status: DC | PRN

## 2022-06-28 MED ORDER — INSULIN ASPART 100 UNIT/ML IJ SOLN
0.0000 [IU] | Freq: Every day | INTRAMUSCULAR | Status: DC
  Administered 2022-06-28: 4 [IU] via SUBCUTANEOUS
  Administered 2022-06-29: 2 [IU] via SUBCUTANEOUS
  Filled 2022-06-28: qty 0.05

## 2022-06-28 MED ORDER — METHYLPREDNISOLONE SODIUM SUCC 40 MG IJ SOLR
40.0000 mg | Freq: Two times a day (BID) | INTRAMUSCULAR | Status: AC
  Administered 2022-06-28 (×2): 40 mg via INTRAVENOUS
  Filled 2022-06-28 (×2): qty 1

## 2022-06-28 NOTE — Assessment & Plan Note (Signed)
Continue CPAP.  

## 2022-06-28 NOTE — Assessment & Plan Note (Signed)
-   Spoke about importance of quitting spent 5 minutes discussing options for treatment, prior attempts at quitting, and dangers of smoking ? -At this point patient is    interested in quitting ? - order nicotine patch  ? - nursing tobacco cessation protocol ? ?

## 2022-06-28 NOTE — Assessment & Plan Note (Signed)
-  chronic avoid nephrotoxic medications such as NSAIDs, Vanco Zosyn combo,  avoid hypotension, continue to follow renal function  

## 2022-06-28 NOTE — Assessment & Plan Note (Signed)
Continue Lipitor 20 mg po q day 

## 2022-06-28 NOTE — Assessment & Plan Note (Signed)
- -  Patient presenting with productive cough, fever   Hypoxia and infiltrate     hypotension requiring aggressive fluid resuscitation    will admit for treatment of CAP will start on appropriate antibiotic coverage. - Rocephin/azithromycin   Obtain:  sputum cultures,                Obtain respiratory panel                    influenza serologies negative                  COVID PCR negative                 blood cultures and sputum cultures ordered                   strep pneumo UA antigen,                     check for Legionella antigen.                Provide oxygen as needed.

## 2022-06-28 NOTE — Assessment & Plan Note (Addendum)
Continue eliquis  ?

## 2022-06-28 NOTE — Progress Notes (Signed)
[facility] PROGRESS NOTE   LEXANDER ZIRK [id number] DOB: [birth year] DOA: 06/27/2022  PCP: Drucilla Chalet, MD  Brief History/Interval Summary: 76 y.o. male with medical history significant of COPD, CHF, a.fib on Eliquis, DM 2, HLD, tobacco abuse, HTN presented with shortness of breath.  He is originally from [facility] and was planning to get back home when his symptoms started.  Imaging studies suggested pneumonia.  Patient did have low-grade fever.  He was hospitalized for further management.   Consultants: None  Procedures: None yet    Subjective/Interval History: Patient mentions that he is feeling slightly better today compared to yesterday.  Continues to have a cough initially with greenish expectoration and now with brown expectoration.  No blood in the sputum.  Denies any chest pain.  No nausea or vomiting.     Assessment/Plan:  Community-acquired pneumonia/acute respiratory failure with hypoxia/sepsis present on admission WBC was noted to be elevated.  He was tachycardic and had tachypnea. Imaging studies raised concern for pneumonia.  Influenza PCR and COVID-19 tests was negative.  Respiratory viral panel is negative.  No PE noted on CT angiogram. Patient started on ceftriaxone and azithromycin which will be continued. Patient is a chronic smoker which is likely contributing to his symptoms. Initially was on 4 L of oxygen by nasal cannula.  Now on 2 L.  Continue to wean to maintain saturations greater than 90% for now. Incentive spirometry. Lactic acid level was normal.  Coronary artery disease Followed by cardiology near [facility]. History of coronary artery stents previously.   Elevated troponin levels noted but likely due to demand ischemia.  Patient denies any chest pain. No ischemic changes noted on EKG. Continue with aspirin, statin.  Not noted to be on beta-blocker. He can follow-up with his cardiologist in [facility].  Paroxysmal atrial  fibrillation Currently in sinus rhythm.  Apixaban being continued.  Not noted to be on any rate limiting drugs. Status post ablation in 2021.  Chronic combined systolic and diastolic CHF According to care everywhere and based on his cardiologist notes it appears that his EF was 50 to 55% in April 2021.  And then a subsequent nuclear stress test showed his EF to be 41%.   Seems to be fairly euvolemic at this time.  Not noted to be on any diuretics prior to admission.  He is on ACE inhibitor which is currently on hold due to elevated creatinine.   As mentioned above he is followed by Dr. Wyonia Hough who is a cardiologist in [facility]. It looks like the admitting provider has ordered an echocardiogram.  We will follow-up.  Chronic kidney disease stage IIIb Renal function stable this morning.  Mildly elevated potassium level noted.  Will recheck tomorrow morning.  Better urine output.  Avoid nephrotoxic agent.  Lisinopril on hold.  COPD with concern for acute exacerbation/tobacco abuse No wheezing heard this morning.  Patient has been started on steroids.  Reasonable to continue at this time.  DuoNebs 3 times a day.  Noted to be on Anoro Ellipta prior to admission.  This can be resumed. Counseled about his smoking.  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  SSI.  Noted to be on metformin prior to admission which is currently on hold. HbA1c is 7.0  Essential hypertension Lisinopril on hold.  Monitor blood pressures closely  Obstructive sleep apnea Continue CPAP  DVT Prophylaxis: On apixaban Code Status: Full code Family  Communication: Discussed with the patient and his wife Disposition Plan: Hopefully return home when improved.  Status is: Inpatient Remains inpatient appropriate because: Acute respiratory failure with hypoxia      Medications: Scheduled:  apixaban  5 mg Oral BID   aspirin EC  81 mg Oral  Daily   atorvastatin  20 mg Oral Daily   guaiFENesin  600 mg Oral BID   insulin aspart  0-15 Units Subcutaneous TID WC   insulin aspart  0-5 Units Subcutaneous QHS   ipratropium-albuterol  3 mL Nebulization TID   methylPREDNISolone (SOLU-MEDROL) injection  40 mg Intravenous Q12H   Followed by   Derrill Memo ON 06/29/2022] predniSONE  40 mg Oral Q breakfast   nicotine  14 mg Transdermal Daily   pantoprazole  40 mg Oral Daily   Continuous:  azithromycin     cefTRIAXone (ROCEPHIN)  IV     [id number] **OR** acetaminophen, albuterol, HYDROcodone-acetaminophen, mouth rinse  Antibiotics: Anti-infectives (From admission, onward)    Start     Dose/Rate Route Frequency Ordered Stop   06/28/22 2359  azithromycin (ZITHROMAX) 500 mg in sodium chloride 0.9 % 250 mL IVPB        500 mg 250 mL/hr over 60 Minutes Intravenous Every 24 hours 06/28/22 0035 07/03/22 2359   06/28/22 2200  cefTRIAXone (ROCEPHIN) 2 g in sodium chloride 0.9 % 100 mL IVPB        2 g 200 mL/hr over 30 Minutes Intravenous Every 24 hours 06/28/22 0035 07/03/22 2159   06/27/22 2145  cefTRIAXone (ROCEPHIN) 1 g in sodium chloride 0.9 % 100 mL IVPB        1 g 200 mL/hr over 30 Minutes Intravenous  Once 06/27/22 2143 06/27/22 2336   06/27/22 2145  azithromycin (ZITHROMAX) 500 mg in sodium chloride 0.9 % 250 mL IVPB        500 mg 250 mL/hr over 60 Minutes Intravenous  Once 06/27/22 2143 06/28/22 0128       Objective:  Vital Signs  Vitals:   06/28/22 0214 06/28/22 0217 06/28/22 0603 06/28/22 1010  BP:  (!) 136/93 (!) 122/50 133/72  Pulse:  69 93 94  Resp:  20 18 20   Temp:  97.6 F (36.4 C) (!) 97.4 F (36.3 C) 97.7 F (36.5 C)  TempSrc:  Oral Oral Oral  SpO2:  95% 97% 95%  Weight: 85.2 kg     Height: 5\' 10"  (1.778 m)       Intake/Output Summary (Last 24 hours) at 06/28/2022 1151 Last data filed at 06/28/2022 1011 Gross per 24 hour  Intake 1081.67 ml  Output 200 ml  Net 881.67 ml   Filed Weights   06/27/22  2047 06/28/22 0214  Weight: 84.4 kg 85.2 kg    General appearance: Awake alert.  In no distress Resp: Crackles bilateral bases.  Scattered wheezes.  No rhonchi.  Normal effort at rest. Cardio: S1-S2 is normal regular.  No S3-S4.  No rubs murmurs or bruit GI: Abdomen is soft.  Nontender nondistended.  Bowel sounds are present normal.  No masses organomegaly Extremities: No edema.  Full range of motion of lower extremities. Neurologic: Alert and oriented x3.  No focal neurological deficits.    Lab Results:  Data Reviewed: I have personally reviewed following labs and reports of the imaging studies  CBC: Recent Labs  Lab 06/27/22 2108 06/28/22 0631  WBC 13.1* 13.3*  NEUTROABS  --  11.8*  HGB 14.0 13.1  HCT 43.3 41.0  MCV 101.4* 102.2*  PLT 249 [id number]    Basic Metabolic Panel: Recent Labs  Lab 06/27/22 2108 06/28/22 0631 06/28/22 0648  NA 141  --  140  K 4.9  --  5.2*  CL 106  --  107  CO2 23  --  27  GLUCOSE 349*  --  337*  BUN 36*  --  36*  CREATININE 1.75*  --  1.61*  CALCIUM 9.0  --  8.6*  MG  --  2.1  --   PHOS  --  4.2  --     GFR: Estimated Creatinine Clearance: 41.6 mL/min (A) (by C-G formula based on SCr of 1.61 mg/dL (H)).  Liver Function Tests: Recent Labs  Lab 06/28/22 0631  AST 29  ALT 49*  ALKPHOS 90  BILITOT 0.7  PROT 7.1  ALBUMIN 3.4*     Coagulation Profile: Recent Labs  Lab 06/27/22 2108  INR 1.7*    Cardiac Enzymes: Recent Labs  Lab 06/28/22 0631  CKTOTAL 110    HbA1C: Recent Labs    06/28/22 0631  HGBA1C 7.0*    CBG: Recent Labs  Lab 06/28/22 0119 06/28/22 0731 06/28/22 1134  GLUCAP 252* 321* 269*    Lipid Profile: Recent Labs    06/28/22 0631  CHOL 78  HDL 26*  LDLCALC 42  TRIG 52  CHOLHDL 3.0    Thyroid Function Tests: Recent Labs    06/28/22 0631  TSH 0.724     Recent Results (from the past 240 hour(s))  Resp Panel by RT-PCR (Flu A&B, Covid) Anterior Nasal Swab     Status: None    Collection Time: 06/27/22  8:54 PM   Specimen: Anterior Nasal Swab  Result Value Ref Range Status   SARS Coronavirus 2 by RT PCR NEGATIVE NEGATIVE Final    Comment: (NOTE) SARS-CoV-2 target nucleic acids are NOT DETECTED.  The SARS-CoV-2 RNA is generally detectable in upper respiratory specimens during the acute phase of infection. The lowest concentration of SARS-CoV-2 viral copies this assay can detect is 138 copies/mL. A negative result does not preclude SARS-Cov-2 infection and should not be used as the sole basis for treatment or other patient management decisions. A negative result may occur with  improper specimen collection/handling, submission of specimen other than nasopharyngeal swab, presence of viral mutation(s) within the areas targeted by this assay, and inadequate number of viral copies(<138 copies/mL). A negative result must be combined with clinical observations, patient history, and epidemiological information. The expected result is Negative.  Fact Sheet for Patients:  EntrepreneurPulse.com.au  Fact Sheet for Healthcare Providers:  IncredibleEmployment.be  This test is no t yet approved or cleared by the [place] FDA and  has been authorized for detection and/or diagnosis of SARS-CoV-2 by FDA under an Emergency Use Authorization (EUA). This EUA will remain  in effect (meaning this test can be used) for the duration of the COVID-19 declaration under Section 564(b)(1) of the Act, 21 U.S.C.section 360bbb-3(b)(1), unless the authorization is terminated  or revoked sooner.       Influenza A by PCR NEGATIVE NEGATIVE Final   Influenza B by PCR NEGATIVE NEGATIVE Final    Comment: (NOTE) The Xpert Xpress SARS-CoV-2/FLU/RSV plus assay is intended as an aid in the diagnosis of influenza from Nasopharyngeal swab specimens and should not be used as a sole basis for treatment. Nasal washings and aspirates are unacceptable for  Xpert Xpress SARS-CoV-2/FLU/RSV testing.  Fact Sheet for Patients: EntrepreneurPulse.com.au  Fact Sheet for Healthcare Providers: IncredibleEmployment.be  This test  is not yet approved or cleared by the [place] and has been authorized for detection and/or diagnosis of SARS-CoV-2 by FDA under an Emergency Use Authorization (EUA). This EUA will remain in effect (meaning this test can be used) for the duration of the COVID-19 declaration under Section 564(b)(1) of the Act, 21 U.S.C. section 360bbb-3(b)(1), unless the authorization is terminated or revoked.  Performed at [facility], [place] [address], [place], [place] [address]   Blood culture (routine x 2)     Status: None (Preliminary result)   Collection Time: 06/27/22  9:11 PM   Specimen: BLOOD  Result Value Ref Range Status   Specimen Description   Final    BLOOD RIGHT ANTECUBITAL Performed at [facility] [address], [place], [place] [address]    Special Requests   Final    BOTTLES DRAWN AEROBIC AND ANAEROBIC Blood Culture results may not be optimal due to an inadequate volume of blood received in culture bottles Performed at [facility] [address], [place], [place] [address]    Culture PENDING  Incomplete   Report Status PENDING  Incomplete  Respiratory (~20 pathogens) panel by PCR     Status: None   Collection Time: 06/28/22  1:45 AM   Specimen: Nasopharyngeal Swab; Respiratory  Result Value Ref Range Status   Adenovirus NOT DETECTED NOT DETECTED Final   Coronavirus 229E NOT DETECTED NOT DETECTED Final    Comment: (NOTE) The Coronavirus on the Respiratory Panel, DOES NOT test for the novel  Coronavirus (2019 nCoV)    Coronavirus HKU1 NOT DETECTED NOT DETECTED Final   Coronavirus NL63 NOT DETECTED NOT DETECTED Final   Coronavirus OC43 NOT DETECTED NOT DETECTED Final   Metapneumovirus NOT DETECTED NOT DETECTED Final    Rhinovirus / Enterovirus NOT DETECTED NOT DETECTED Final   Influenza A NOT DETECTED NOT DETECTED Final   Influenza B NOT DETECTED NOT DETECTED Final   Parainfluenza Virus 1 NOT DETECTED NOT DETECTED Final   Parainfluenza Virus 2 NOT DETECTED NOT DETECTED Final   Parainfluenza Virus 3 NOT DETECTED NOT DETECTED Final   Parainfluenza Virus 4 NOT DETECTED NOT DETECTED Final   Respiratory Syncytial Virus NOT DETECTED NOT DETECTED Final   Bordetella pertussis NOT DETECTED NOT DETECTED Final   Bordetella Parapertussis NOT DETECTED NOT DETECTED Final   Chlamydophila pneumoniae NOT DETECTED NOT DETECTED Final   Mycoplasma pneumoniae NOT DETECTED NOT DETECTED Final    Comment: Performed at [facility], [place]. [address], [place], [place] [address]  MRSA Next Gen by PCR, Nasal     Status: None   Collection Time: 06/28/22  2:34 AM   Specimen: Nasal Mucosa; Nasal Swab  Result Value Ref Range Status   MRSA by PCR Next Gen NOT DETECTED NOT DETECTED Final    Comment: (NOTE) The GeneXpert MRSA Assay (FDA approved for NASAL specimens only), is one component of a comprehensive MRSA colonization surveillance program. It is not intended to diagnose MRSA infection nor to guide or monitor treatment for MRSA infections. Test performance is not FDA approved in patients less than 74 years old. Performed at [facility], [place] [address], [place], [place] [address]       Radiology Studies: CT Angio Chest PE W and/or Wo Contrast  Result Date: 06/27/2022 CLINICAL DATA:  Shortness of breath for several days with fevers EXAM: CT ANGIOGRAPHY CHEST WITH CONTRAST TECHNIQUE: Multidetector CT imaging of the chest was performed using the  standard protocol during bolus administration of intravenous contrast. Multiplanar CT image reconstructions and MIPs were obtained to evaluate the vascular anatomy. RADIATION DOSE REDUCTION: This exam was performed according to the departmental dose-optimization  program which includes automated exposure control, adjustment of the mA and/or kV according to patient size and/or use of iterative reconstruction technique. CONTRAST:  23mL OMNIPAQUE IOHEXOL 350 MG/ML SOLN COMPARISON:  Chest x-ray from earlier in the same day. FINDINGS: Cardiovascular: Thoracic aorta shows atherosclerotic calcifications. No aneurysmal dilatation or dissection is noted. No cardiac enlargement is seen. Coronary calcifications are noted. The pulmonary artery shows a normal branching pattern bilaterally. No filling defect to suggest pulmonary embolism is noted. Mediastinum/Nodes: Thoracic inlet is within normal limits. No hilar or mediastinal adenopathy is noted. Scattered small mediastinal nodes are seen likely reactive in nature. The esophagus is within normal limits. Lungs/Pleura: Somewhat limited by patient motion artifact. Diffuse tree-in-bud nodularity is noted throughout both lungs consistent with inflammatory change. No sizable effusion is noted. No sizable parenchymal nodules are seen. Upper Abdomen: Upper pole left renal cyst is noted which appears simple in nature. No further follow-up is recommended. Musculoskeletal: Degenerative changes of the thoracic spine are noted. Review of the MIP images confirms the above findings. IMPRESSION: Diffuse changes throughout both lungs consistent with underlying inflammatory change likely atypical pneumonia. No evidence of pulmonary emboli. Aortic Atherosclerosis (ICD10-I70.0). Electronically Signed   By: Inez Catalina M.D.   On: 06/27/2022 23:47   DG Chest 2 View  Result Date: 06/27/2022 CLINICAL DATA:  Shortness of breath EXAM: CHEST - 2 VIEW COMPARISON:  Radiographs 10/18/2010 FINDINGS: Mild bronchovascular crowding in the lung bases with bronchial wall thickening. No focal consolidation, pleural effusion, or pneumothorax. Normal cardiomediastinal silhouette. No acute osseous abnormality. IMPRESSION: Mild bronchitis.  No focal consolidation.  Electronically Signed   By: Placido Sou M.D.   On: 06/27/2022 21:43       LOS: 0 days   [facility] Pager on [email or web address]  06/28/2022, 11:51 AM

## 2022-06-28 NOTE — Progress Notes (Signed)
Mobility Specialist - Progress Note   06/28/22 1439  Mobility  Activity Ambulated with assistance in hallway  Level of Assistance Standby assist, set-up cues, supervision of patient - no hands on  Assistive Device None  Distance Ambulated (ft) 200 ft  Activity Response Tolerated well  $Mobility charge 1 Mobility   Pt received in bed and agreed for mobility, no c/o pain, did feel SOB. Returned to bed and checked O2 and was at 85%. Notified RN and Pt's O2 returned to 93% after resting. Left with all needs met still in bed.   Roderick Pee Mobility Specialist

## 2022-06-28 NOTE — Subjective & Objective (Signed)
Shortness of breath for the past few days  Hx of COPD CHF Originally from [place] Hypoxic to mid 80 on RA Febrile up to 100.5 on arrival

## 2022-06-28 NOTE — Assessment & Plan Note (Signed)
this patient has acute respiratory failure with Hypoxia as documented by the presence of following: O2 saturatio< 90% on RA   Likely due to:  Pneumonia COPD exacerbation,  Provide O2 therapy and titrate as needed  Continuous pulse ox   check Pulse ox with ambulation prior to discharge  may need  TC consult for home O2 set up  flutter valve ordered

## 2022-06-28 NOTE — Assessment & Plan Note (Signed)
Hx of stenting,  CAD status post stenting of his circumflex in 2010 after a STEMI event, no intervention since.  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

## 2022-06-28 NOTE — Assessment & Plan Note (Signed)
-  -   Will initiate: Steroid taper  -  Antibiotics  Rocephin/aithro - Albuterol PRN, - scheduled duoneb,  -  Breo or Dulera at discharge   -  Mucinex.  Titrate O2 to saturation >90%. Follow patients respiratory status.  influenza PCR negative   VBG ordered

## 2022-06-28 NOTE — ED Provider Notes (Signed)
Patient signed out to me at shift change.  When I evaluated the patient, he was non-toxic, was on 4L [place] maintaining normal O2 sats, had coarse cough.  Seems consistent with pneumonia.  Case discussed with Dr. Roel Cluck, who is appreciated for admitting.   Montine Circle, PA-C 06/28/22 0034    Fatima Blank, MD 06/28/22 [phone]

## 2022-06-28 NOTE — Assessment & Plan Note (Signed)
Lisinopril on hold

## 2022-06-28 NOTE — Progress Notes (Signed)
Patient stated that he does not want to wear cpap due to congestion and coughing

## 2022-06-28 NOTE — Assessment & Plan Note (Signed)
-  SIRS criteria met with  elevated white blood cell count,       Component Value Date/Time   WBC 13.1 (H) 06/27/2022 2108    tachycardia   ,  Fever   RR >20 Today's Vitals   06/27/22 2314 06/27/22 2314 06/27/22 2330 06/28/22 0030  BP:   108/72 119/68  Pulse:   72 83  Resp:   (!) 27 (!) 24  Temp:  98.5 F (36.9 C)    TempSrc:  Oral    SpO2:   100% 96%  Weight:      Height:      PainSc: 0-No pain      Body mass index is 26.69 kg/m.  This patient meets SIRS Criteria and may be septic.    The recent clinical data is shown below. Vitals:   06/27/22 2200 06/27/22 2314 06/27/22 2330 06/28/22 0030  BP: 126/68  108/72 119/68  Pulse: 89  72 83  Resp: (!) 24  (!) 27 (!) 24  Temp:  98.5 F (36.9 C)    TempSrc:  Oral    SpO2: 92%  100% 96%  Weight:      Height:        -Most likely source being , pulmonary  Source of sepsis is unknown but given clinical picture will continue to treat   Patient meeting criteria for Severe sepsis with    evidence of end organ damage/organ dysfunction such as   elevated troponin  Acute hypoxia requiring new supplemental oxygen, SpO2: 96 % O2 Flow Rate (L/min): 4 L/min      - Obtain serial lactic acid and procalcitonin level.  - Initiated IV antibiotics in ER: Antibiotics Given (last 72 hours)    Date/Time Action Medication Dose Rate   06/27/22 2236 New Bag/Given   cefTRIAXone (ROCEPHIN) 1 g in sodium chloride 0.9 % 100 mL IVPB 1 g 200 mL/hr   06/28/22 0014 New Bag/Given   azithromycin (ZITHROMAX) 500 mg in sodium chloride 0.9 % 250 mL IVPB 500 mg 250 mL/hr      Will continue  on : rocephin azithromycine   - await results of blood and urine culture  - Rehydrate aggressively  Intravenous fluids were administered      12:37 AM

## 2022-06-28 NOTE — H&P (Signed)
Jeff White:[id number] DOB: 07-17-1946 DOA: 06/27/2022     PCP: Albertine Grates, MD   Outpatient Specialists:   CARDS:   [facility]   NEphrology:    Pulmonary   Patient arrived to ER on 06/27/22 at 2027 Referred by Attending Cardama, Grayce Sessions, *   Patient coming from:    home Lives  With family   Chief Complaint:   Chief Complaint  Patient presents with   Shortness of Breath    HPI: Jeff White is a 76 y.o. male with medical history significant of COPD, CHF, a.fib on Eliquis, DM 2, HLD, tobacco abuse, HTN     Presented with  shortness of breath  Shortness of breath for the past few days  Hx of COPD CHF Originally from [place] Hypoxic to mid 80 on RA Febrile up to 100.5 on arrival   Continue to smoke 1/2 pack a day interested in quitting  No EtoH  No fever no chills cough productive of green thick mucus  And increased wheezing    Had solumedrol by EMS   Initial COVID TEST  NEGATIVE   Lab Results  Component Value Date   [place] NEGATIVE 06/27/2022     Regarding pertinent Chronic problems:     Hyperlipidemia -  on statins Lipitor (atorvastatin)  Lipid Panel      HTN off lisinopril   chronic CHF diastolic/systolic/ combined - last [id number] EF 50-555 lexiscan EF 41%    CAD  - On Aspirin, statin,                  -  followed by cardiology                     DM 2 -  on PO meds only,       COPD -   followed by pulmonology   not  on baseline oxygen       OSA -on nocturnal  CPAP,        A. Fib -  - CHA2DS2 vas score  >3   current  on anticoagulation with  Eliquis,       CKD stage IIIa- baseline Cr 1.4 Estimated Creatinine Clearance: 38.2 mL/min (A) (by C-G formula based on SCr of 1.75 mg/dL (H)).  Lab Results  Component Value Date   CREATININE 1.75 (H) 06/27/2022       While in ER: Clinical Course as of 06/28/22 0026  Sat Jun 27, 2022  2202 Per most recent Cards note:  Mr.  Jeff White is a 76 year old gentleman, patient of Dr. Manuella Ghazi MD who used to see my colleague Dr. Buel Ream and has elected to see me He does have history of CAD status post stenting of his circumflex in 2010 after a STEMI event, no intervention since. He had last seen Dr. Buel Ream in June 2016 His risk factors include dyslipidemia COPD, diabetes and renal insufficiency In my initial visit on April 21 I recommended labs echo stress test and a monitor Labs were done on April [id number] which showed hemoglobin 16 platelet count 168 sodium 143 potassium 5.4 creatinine 1.55 GFR was 44  Echocardiogram on 23 January 2020 showed normal LV chamber and wall dimensions EF 50 to 55%, RV both atria normal normal valves Lexiscan nuclear stress test on January 25, 2020 showed old inferoapical infarct no ischemia EF 41% I sent him to Dr Michaell Cowing who did his PVC ablation  in September 2021  [GL]  2204 Troponin 390  [GL]    Clinical Course User Index [GL] Loeffler, Grace C, PA-C       CXR - Mild bronchitis.  No focal consolidation.    CTA chest -    no PE,  Diffuse changes throughout both lungs consistent with underlying inflammatory change likely atypical pneumonia.  Following Medications were ordered in ER: Medications  albuterol (VENTOLIN HFA) 108 (90 Base) MCG/ACT inhaler 2 puff (has no administration in time range)  azithromycin (ZITHROMAX) 500 mg in sodium chloride 0.9 % 250 mL IVPB (500 mg Intravenous New Bag/Given 06/28/22 0014)  ipratropium-albuterol (DUONEB) 0.5-2.5 (3) MG/3ML nebulizer solution 3 mL (3 mLs Nebulization Given 06/27/22 2147)  methylPREDNISolone sodium succinate (SOLU-MEDROL) 125 mg/2 mL injection 125 mg (125 mg Intravenous Given 06/27/22 2221)  acetaminophen (TYLENOL) tablet 1,000 mg (1,000 mg Oral Given 06/27/22 2205)  cefTRIAXone (ROCEPHIN) 1 g in sodium chloride 0.9 % 100 mL IVPB (1 g Intravenous New Bag/Given 06/27/22 2236)  iohexol (OMNIPAQUE) 350 MG/ML injection 75 mL (75 mLs  Intravenous Contrast Given 06/27/22 2248)    ______________    ED Triage Vitals  Enc Vitals Group     BP 06/27/22 2045 105/63     Pulse Rate 06/27/22 2045 (!) 106     Resp 06/27/22 2045 (!) 25     Temp 06/27/22 2045 (!) 100.5 F (38.1 C)     Temp Source 06/27/22 2045 Oral     SpO2 06/27/22 2045 (!) 87 %     Weight 06/27/22 2047 186 lb (84.4 kg)     Height 06/27/22 2047 _0  (1.778 m)     Head Circumference --      Peak Flow --      Pain Score 06/27/22 2046 0     Pain Loc --      Pain Edu? --      Excl. in [place]? --   TMAX(24)@     _________________________________________ Significant initial  Findings: Abnormal Labs Reviewed  BASIC METABOLIC PANEL - Abnormal; Notable for the following components:      Result Value   Glucose, Bld 349 (*)    BUN 36 (*)    Creatinine, Ser 1.75 (*)    GFR, Estimated 40 (*)    All other components within normal limits  CBC - Abnormal; Notable for the following components:   WBC 13.1 (*)    MCV 101.4 (*)    All other components within normal limits  PROTIME-INR - Abnormal; Notable for the following components:   Prothrombin Time 19.6 (*)    INR 1.7 (*)    All other components within normal limits  BRAIN NATRIURETIC PEPTIDE - Abnormal; Notable for the following components:   B Natriuretic Peptide 194.9 (*)    All other components within normal limits  TROPONIN I (HIGH SENSITIVITY) - Abnormal; Notable for the following components:   Troponin I (High Sensitivity) 390 (*)    All other components within normal limits    _________________________ Troponin 390 ECG: Ordered Personally reviewed and interpreted by me showing: HR : 89 Rhythm:     Sinus rhythm Atrial premature complexes  QTC 441   ____________________ This patient meets SIRS Criteria and may be septic.    The recent clinical data is shown below. Vitals:   06/27/22 2147 06/27/22 2200 06/27/22 2314 06/27/22 2330  BP:  126/68  108/72  Pulse:  89  72  Resp:  (!) 24  (!) 27  Temp:   98.5 F (36.9 C)   TempSrc:   Oral   SpO2: 97% 92%  100%  Weight:      Height:          WBC     Component Value Date/Time   WBC 13.1 (H) 06/27/2022 2108     Lactic Acid, Venous    Component Value Date/Time   LATICACIDVEN 1.7 06/27/2022 2108      Results for orders placed or performed during the hospital encounter of 06/27/22  Resp Panel by RT-PCR (Flu A&B, Covid) Anterior Nasal Swab     Status: None   Collection Time: 06/27/22  8:54 PM   Specimen: Anterior Nasal Swab  Result Value Ref Range Status   SARS Coronavirus 2 by RT PCR NEGATIVE NEGATIVE Final         Influenza A by PCR NEGATIVE NEGATIVE Final   Influenza B by PCR NEGATIVE NEGATIVE Final        Blood culture (routine x 2)     Status: None (Preliminary result)   Collection Time: 06/27/22  9:11 PM   Specimen: BLOOD  Result Value Ref Range Status   Specimen Description   Final    BLOOD RIGHT ANTECUBITAL Performed at [facility] [address], [place], [place] [address]    Special Requests   Final    BOTTLES DRAWN AEROBIC AND ANAEROBIC Blood Culture results may not be optimal due to an inadequate volume of blood received in culture bottles Performed at [facility] [address], [place],  [address]    Culture PENDING  Incomplete   Report Status PENDING  Incomplete    _______________________________________________ Hospitalist was called for admission for   Acute respiratory failure with hypoxia  due to CAP    The following Work up has been ordered so far:  Orders Placed This Encounter  Procedures   Critical Care   Resp Panel by RT-PCR (Flu A&B, Covid) Anterior Nasal Swab   Blood culture (routine x 2)   DG Chest 2 View   CT Angio Chest PE W and/or Wo Contrast   Basic metabolic panel   CBC   Protime-INR (order if Patient is taking Coumadin / Warfarin)   Brain natriuretic peptide   Lactic acid, plasma   Urinalysis, Routine w reflex microscopic   Utilize  spacer/aerochamber with mdi inhaler for COVID-19 positive patients or PUI for COVID-19   If O2 Sat <94% administer O2 at 2 liters/minute via nasal cannula   Cardiac monitoring   Consult to hospitalist   Airborne and Contact precautions   Pulse oximetry, continuous   Adult wheeze protocol - initiate by RT   Pulse oximetry (single)   ED EKG   ED EKG   Saline lock IV   Insert peripheral IV     OTHER Significant initial  Findings:  labs showing:    Recent Labs  Lab 06/27/22 2108  NA 141  K 4.9  CO2 23  GLUCOSE 349*  BUN 36*  CREATININE 1.75*  CALCIUM 9.0    Cr   Up from baseline see below Lab Results  Component Value Date   CREATININE 1.75 (H) 06/27/2022    No results for input(s): "AST", "ALT", "ALKPHOS", "BILITOT", "PROT", "ALBUMIN" in the last 168 hours. Lab Results  Component Value Date   CALCIUM 9.0 06/27/2022        Plt: Lab Results  Component Value Date   PLT 249 06/27/2022    COVID-19 Labs  No results for input(s): "DDIMER", "FERRITIN", "LDH", "CRP" in the last 72 hours.  Lab Results  Component Value Date   [place] NEGATIVE 06/27/2022        Recent Labs  Lab 06/27/22 2108  WBC 13.1*  HGB 14.0  HCT 43.3  MCV 101.4*  PLT 249    HG/HCT  stable,      Component Value Date/Time   HGB 14.0 06/27/2022 2108   HCT 43.3 06/27/2022 2108   MCV 101.4 (H) 06/27/2022 2108    BNP (last 3 results) Recent Labs    06/27/22 2108  BNP 194.9*    DM  labs:  HbA1C: No results for input(s): "HGBA1C" in the last 8760 hours.     CBG (last 3)  No results for input(s): "GLUCAP" in the last 72 hours.        Cultures:    Component Value Date/Time   SDES  06/27/2022 2111    BLOOD RIGHT ANTECUBITAL Performed at [facility], [place] [address], [place], [place] [address]    SPECREQUEST  06/27/2022 2111    BOTTLES DRAWN AEROBIC AND ANAEROBIC Blood Culture results may not be optimal due to an inadequate volume of blood received in  culture bottles Performed at [facility], [address], [place], [place] [address]    CULT PENDING 06/27/2022 2111   REPTSTATUS PENDING 06/27/2022 2111     Radiological Exams on Admission: CT Angio Chest PE W and/or Wo Contrast  Result Date: 06/27/2022 CLINICAL DATA:  Shortness of breath for several days with fevers EXAM: CT ANGIOGRAPHY CHEST WITH CONTRAST TECHNIQUE: Multidetector CT imaging of the chest was performed using the standard protocol during bolus administration of intravenous contrast. Multiplanar CT image reconstructions and MIPs were obtained to evaluate the vascular anatomy. RADIATION DOSE REDUCTION: This exam was performed according to the departmental dose-optimization program which includes automated exposure control, adjustment of the mA and/or kV according to patient size and/or use of iterative reconstruction technique. CONTRAST:  16m OMNIPAQUE IOHEXOL 350 MG/ML SOLN COMPARISON:  Chest x-ray from earlier in the same day. FINDINGS: Cardiovascular: Thoracic aorta shows atherosclerotic calcifications. No aneurysmal dilatation or dissection is noted. No cardiac enlargement is seen. Coronary calcifications are noted. The pulmonary artery shows a normal branching pattern bilaterally. No filling defect to suggest pulmonary embolism is noted. Mediastinum/Nodes: Thoracic inlet is within normal limits. No hilar or mediastinal adenopathy is noted. Scattered small mediastinal nodes are seen likely reactive in nature. The esophagus is within normal limits. Lungs/Pleura: Somewhat limited by patient motion artifact. Diffuse tree-in-bud nodularity is noted throughout both lungs consistent with inflammatory change. No sizable effusion is noted. No sizable parenchymal nodules are seen. Upper Abdomen: Upper pole left renal cyst is noted which appears simple in nature. No further follow-up is recommended. Musculoskeletal: Degenerative changes of the thoracic spine are noted. Review of the MIP  images confirms the above findings. IMPRESSION: Diffuse changes throughout both lungs consistent with underlying inflammatory change likely atypical pneumonia. No evidence of pulmonary emboli. Aortic Atherosclerosis (ICD10-I70.0). Electronically Signed   By: MInez CatalinaM.D.   On: 06/27/2022 23:47   DG Chest 2 View  Result Date: 06/27/2022 CLINICAL DATA:  Shortness of breath EXAM: CHEST - 2 VIEW COMPARISON:  Radiographs 10/18/2010 FINDINGS: Mild bronchovascular crowding in the lung bases with bronchial wall thickening. No focal consolidation, pleural effusion, or pneumothorax. Normal cardiomediastinal silhouette. No acute osseous abnormality. IMPRESSION: Mild bronchitis.  No focal consolidation. Electronically Signed   By: TPlacido Sou  M.D.   On: 06/27/2022 21:43   _______________________________________________________________________________________________________ Latest  Blood pressure 108/72, pulse 72, temperature 98.5 F (36.9 C), temperature source Oral, resp. rate (!) 27, height _0  (1.778 m), weight 84.4 kg, SpO2 100 %.   Vitals  labs and radiology finding personally reviewed  Review of Systems:    Pertinent positives include:   shortness of breath at rest.Fevers, chills Constitutional:  No weight loss, night sweats, , fatigue, weight loss  HEENT:  No headaches, Difficulty swallowing,Tooth/dental problems,Sore throat,  No sneezing, itching, ear ache, nasal congestion, post nasal drip,  Cardio-vascular:  No chest pain, Orthopnea, PND, anasarca, dizziness, palpitations.no Bilateral lower extremity swelling  GI:  No heartburn, indigestion, abdominal pain, nausea, vomiting, diarrhea, change in bowel habits, loss of appetite, melena, blood in stool, hematemesis Resp:  no  No dyspnea on exertion, No excess mucus, no productive cough, No non-productive cough, No coughing up of blood.No change in color of mucus.No wheezing. Skin:  no rash or lesions. No jaundice GU:  no dysuria,  change in color of urine, no urgency or frequency. No straining to urinate.  No flank pain.  Musculoskeletal:  No joint pain or no joint swelling. No decreased range of motion. No back pain.  Psych:  No change in mood or affect. No depression or anxiety. No memory loss.  Neuro: no localizing neurological complaints, no tingling, no weakness, no double vision, no gait abnormality, no slurred speech, no confusion  All systems reviewed and apart from [place] all are negative _______________________________________________________________________________________________ Past Medical History:  History reviewed. No pertinent past medical history.    History reviewed. No pertinent surgical history.  Social History:  Ambulatory   independently       has no history on file for tobacco use, alcohol use, and drug use.     Family History:   History reviewed. No pertinent family history. ______________________________________________________________________________________________ Allergies: No Known Allergies   Prior to Admission medications   Not on File    ___________________________________________________________________________________________________ Physical Exam:    06/27/2022   11:30 PM 06/27/2022   10:00 PM 06/27/2022    8:47 PM  Vitals with BMI  Height   _1   Weight   186 lbs  BMI   97.35  Systolic 329 924   Diastolic 72 68   Pulse 72 89      1. General:  in No  Acute distress   Chronically ill   2. Psychological: Alert and   Oriented 3. Head/ENT:    Dry Mucous Membranes                          Head Non traumatic, neck supple                           Poor Dentition 4. SKIN:  decreased Skin turgor,  Skin clean Dry and intact no rash 5. Heart: Regular rate and rhythm no  Murmur, no Rub or gallop 6. Lungs:   no wheezes some crackles   7. Abdomen: Soft,  non-tender, Non distended   obese  bowel sounds present 8. Lower extremities: no clubbing, cyanosis, no   edema 9. Neurologically Grossly intact, moving all 4 extremities equally   10. MSK: Normal range of motion    Chart has been reviewed  ______________________________________________________________________________________________  Assessment/Plan 76 y.o. male with medical history significant of COPD, CHF, a.fib on Eliquis, DM 2, HLD, tobacco abuse, HTN  Admitted for   Acute respiratory failure with hypoxia (HCC)  And CAP resulting in sepsis    Present on Admission:  Sepsis ([place])  CAP (community acquired pneumonia)  CKD (chronic kidney disease), stage III (HCC)  Paroxysmal A-fib (HCC)  CAD (coronary artery disease)  COPD with acute exacerbation ([place])  Hyperlipidemia  DM (diabetes mellitus), type 2 with complications (HCC)  Benign essential HTN  Tobacco abuse  Elevated troponin  Chronic combined systolic and diastolic CHF (congestive heart failure) (HCC)  OSA (obstructive sleep apnea)  Acute respiratory failure with hypoxia (HCC)     Sepsis ([place])  -SIRS criteria met with  elevated White blood cell count,       Component Value Date/Time   WBC 13.1 (H) 06/27/2022 2108    tachycardia   ,  Fever   RR >20 Today's Vitals   06/27/22 2314 06/27/22 2314 06/27/22 2330 06/28/22 0030  BP:   108/72 119/68  Pulse:   72 83  Resp:   (!) 27 (!) 24  Temp:  98.5 F (36.9 C)    TempSrc:  Oral    SpO2:   100% 96%  Weight:      Height:      PainSc: 0-No pain      Body mass index is 26.69 kg/m.  This patient meets SIRS Criteria and may be septic.    The recent clinical data is shown below. Vitals:   06/27/22 2200 06/27/22 2314 06/27/22 2330 06/28/22 0030  BP: 126/68  108/72 119/68  Pulse: 89  72 83  Resp: (!) 24  (!) 27 (!) 24  Temp:  98.5 F (36.9 C)    TempSrc:  Oral    SpO2: 92%  100% 96%  Weight:      Height:        -Most likely source being , pulmonary  Source of sepsis is unknown but given clinical picture will continue to treat   Patient meeting criteria for  Severe sepsis with    evidence of end organ damage/organ dysfunction such as   elevated troponin  Acute hypoxia requiring new supplemental oxygen, SpO2: 96 % O2 Flow Rate (L/min): 4 L/min      - Obtain serial lactic acid and procalcitonin level.  - Initiated IV antibiotics in ER: Antibiotics Given (last 72 hours)     Date/Time Action Medication Dose Rate   06/27/22 2236 New Bag/Given   cefTRIAXone (ROCEPHIN) 1 g in sodium chloride 0.9 % 100 mL IVPB 1 g 200 mL/hr   06/28/22 0014 New Bag/Given   azithromycin (ZITHROMAX) 500 mg in sodium chloride 0.9 % 250 mL IVPB 500 mg 250 mL/hr       Will continue  on : rocephin azithromycine   - await results of blood and urine culture  - Rehydrate aggressively  Intravenous fluids were administered      12:37 AM   CAP (community acquired pneumonia)  - -Patient presenting with productive cough, fever   Hypoxia and infiltrate     hypotension requiring aggressive fluid resuscitation    will admit for treatment of CAP will start on appropriate antibiotic coverage. - Rocephin/azithromycin   Obtain:  sputum cultures,                Obtain respiratory panel                    influenza serologies negative  COVID PCR negative                 blood cultures and sputum cultures ordered                   strep pneumo UA antigen,                     check for Legionella antigen.                Provide oxygen as needed.     CKD (chronic kidney disease), stage III (HCC)  -chronic avoid nephrotoxic medications such as NSAIDs, Vanco Zosyn combo,  avoid hypotension, continue to follow renal function   Paroxysmal A-fib ([place]) Continue eliquis    CAD (coronary artery disease) Hx of stenting,  CAD status post stenting of his circumflex in 2010 after a STEMI event, no intervention since.  Lexiscan nuclear stress test on January 25, 2020 showed old inferoapical infarct no ischemia EF 41% On aspirin 81 po q day and lipitor 20 mg po q  day  COPD with acute exacerbation ([place])  -  - Will initiate: Steroid taper  -  Antibiotics  Rocephin/aithro - Albuterol PRN, - scheduled duoneb,  -  Breo or Dulera at discharge   -  Mucinex.  Titrate O2 to saturation >90%. Follow patients respiratory status.  influenza PCR negative   VBG ordered    Hyperlipidemia Continue Lipitor 20 mgpo q day  DM (diabetes mellitus), type 2 with complications (HCC)  - Order Sensitive  SSI     -  check TSH and HgA1C  - Hold by mouth medications     Benign essential HTN Lisinopril on hold  Tobacco abuse  - Spoke about importance of quitting spent 5 minutes discussing options for treatment, prior attempts at quitting, and dangers of smoking  -At this point patient is    interested in quitting  - order nicotine patch   - nursing tobacco cessation protocol   Elevated troponin Hx of CAD no CP no ECG changes  Obtain echo in AM if continues to rise will need Cardiology consult Echo in AM  Chronic combined systolic and diastolic CHF (congestive heart failure) ([place]) - currently appears to be slightly on the dry side, hold home diuretics for tonight and restart when appears euvolemic, carefuly follow fluid status and Cr   OSA (obstructive sleep apnea) Continue CPAP  Acute respiratory failure with hypoxia (HCC)  this patient has acute respiratory failure with Hypoxia as documented by the presence of following: O2 saturatio< 90% on RA   Likely due to:  Pneumonia COPD exacerbation,  Provide O2 therapy and titrate as needed  Continuous pulse ox   check Pulse ox with ambulation prior to discharge  may need  TC consult for home O2 set up  flutter valve ordered   Other plan as per orders.  DVT prophylaxis:   eliquis       Code Status:    Code Status: Not on file FULL CODE   as per patient   I had personally discussed CODE STATUS with patient and family    Family Communication:   Family  at  Bedside  plan of care was discussed  with    Brother  in law  Disposition Plan:    To home once workup is complete and patient is stable   Following barriers for discharge:  Electrolytes corrected                               Anemia corrected                             Pain controlled with PO medications                               Afebrile, White count improving able to transition to PO antibiotics                             Will need to be able to tolerate PO                            Will likely need home health, home O2, set up                           Will need consultants to evaluate patient prior to discharge                       Would benefit from PT/OT eval prior to DC  Ordered                                    Diabetes care coordinator                   Transition of care consulted                   Nutrition    consulted                  Wound care  consulted                   Palliative care    consulted                   Behavioral health  consulted                    Consults called: none  Admission status:  ED Disposition     ED Disposition  [place]: [place] [[id number]]  Level of Care: Progressive [102]  Admit to Progressive based on following criteria: RESPIRATORY PROBLEMS hypoxemic/hypercapnic respiratory failure that is responsive to NIPPV (BiPAP) or High Flow Nasal Cannula (6-80 lpm). Frequent assessment/intervention, no > Q2 hrs < Q4 hrs, to maintain oxygenation and pulmonary hygiene.  Admit to Progressive based on following criteria: MULTISYSTEM THREATS such as stable sepsis, metabolic/electrolyte imbalance with or without encephalopathy that is responding to early treatment.  May admit patient to [facility] or [facility] if equivalent level of care is available:: No  Covid Evaluation: Asymptomatic - no recent exposure (last 10 days) testing not required  Diagnosis: Sepsis [facility]) [[id number]]  Admitting  Physician: Toy Baker [[id number]]  Attending Physician: Toy Baker [[id number]]  Certification:: I certify this patient will need inpatient services for at least 2 midnights  Estimated Length of Stay: 2  inpatient     I Expect 2 midnight stay secondary to severity of patient's current illness need for inpatient interventions justified by the following:  hemodynamic instability despite optimal treatment ( hypoxia )  Severe lab/radiological/exam abnormalities including:    CAP and extensive comorbidities including:  DM2   CHF   CAD  COPD/asthma CKD   Chronic anticoagulation  That are currently affecting medical management.   I expect  patient to be hospitalized for 2 midnights requiring inpatient medical care.  Patient is at high risk for adverse outcome (such as loss of life or disability) if not treated.  Indication for inpatient stay as follows:    New or worsening hypoxia    Need for IV antibiotics, IV fluids,     Level of care         progressive tele indefinitely please discontinue once patient no longer qualifies COVID-19 Labs    Lab Results  Component Value Date   [place] 06/27/2022     Precautions: admitted as   Covid Negative   Jerry Clyne 06/28/2022, 1:45 AM    [facility]     after 2 AM please page floor coverage PA If 7AM-7PM, please contact the day team taking care of the patient using [employer]   Patient was evaluated in the context of the global COVID-19 pandemic, which necessitated consideration that the patient might be at risk for infection with the SARS-CoV-2 virus that causes COVID-19. Institutional protocols and algorithms that pertain to the evaluation of patients at risk for COVID-19 are in a state of rapid change based on information released by regulatory bodies including the CDC and federal and state organizations. These policies and algorithms were followed during the patient's care.

## 2022-06-28 NOTE — Assessment & Plan Note (Signed)
-   currently appears to be slightly on the dry side, hold home diuretics for tonight and restart when appears euvolemic, carefuly follow fluid status and Cr  

## 2022-06-28 NOTE — Assessment & Plan Note (Signed)
-   Order Sensitive  SSI     -  check TSH and HgA1C  - Hold by mouth medications*  

## 2022-06-28 NOTE — Assessment & Plan Note (Signed)
Hx of CAD no CP no ECG changes  Obtain echo in AM if continues to rise will need Cardiology consult Echo in AM

## 2022-06-29 ENCOUNTER — Encounter (HOSPITAL_COMMUNITY): Payer: Self-pay | Admitting: Internal Medicine

## 2022-06-29 DIAGNOSIS — J189 Pneumonia, unspecified organism: Secondary | ICD-10-CM | POA: Diagnosis not present

## 2022-06-29 DIAGNOSIS — I5042 Chronic combined systolic (congestive) and diastolic (congestive) heart failure: Secondary | ICD-10-CM | POA: Diagnosis not present

## 2022-06-29 DIAGNOSIS — I480 Paroxysmal atrial fibrillation: Secondary | ICD-10-CM | POA: Diagnosis not present

## 2022-06-29 DIAGNOSIS — E118 Type 2 diabetes mellitus with unspecified complications: Secondary | ICD-10-CM | POA: Diagnosis not present

## 2022-06-29 LAB — BASIC METABOLIC PANEL
Anion gap: 8 (ref 5–15)
BUN: 45 mg/dL — ABNORMAL HIGH (ref 8–23)
CO2: 28 mmol/L (ref 22–32)
Calcium: 8.9 mg/dL (ref 8.9–10.3)
Chloride: 107 mmol/L (ref 98–111)
Creatinine, Ser: 1.61 mg/dL — ABNORMAL HIGH (ref 0.61–1.24)
GFR, Estimated: 45 mL/min — ABNORMAL LOW (ref >60)
Glucose, Bld: 286 mg/dL — ABNORMAL HIGH (ref 70–99)
Potassium: 4.7 mmol/L (ref 3.5–5.1)
Sodium: 143 mmol/L (ref 135–145)

## 2022-06-29 LAB — CBC
HCT: 42.7 % (ref 39.0–52.0)
Hemoglobin: 13.3 g/dL (ref 13.0–17.0)
MCH: 32.4 pg (ref 26.0–34.0)
MCHC: 31.1 g/dL (ref 30.0–36.0)
MCV: 104.1 fL — ABNORMAL HIGH (ref 80.0–100.0)
Platelets: 272 10*3/uL (ref 150–400)
RBC: 4.1 MIL/uL — ABNORMAL LOW (ref 4.22–5.81)
RDW: 12.5 % (ref 11.5–15.5)
WBC: 15.4 10*3/uL — ABNORMAL HIGH (ref 4.0–10.5)
nRBC: 0.1 % (ref 0.0–0.2)

## 2022-06-29 LAB — OSMOLALITY, URINE: Osmolality, Ur: 803 mOsm/kg (ref 300–900)

## 2022-06-29 LAB — GLUCOSE, CAPILLARY
Glucose-Capillary: 249 mg/dL — ABNORMAL HIGH (ref 70–99)
Glucose-Capillary: 263 mg/dL — ABNORMAL HIGH (ref 70–99)
Glucose-Capillary: 277 mg/dL — ABNORMAL HIGH (ref 70–99)
Glucose-Capillary: 250 mg/dL — ABNORMAL HIGH (ref 70–99)

## 2022-06-29 LAB — OSMOLALITY: Osmolality: 318 mOsm/kg — ABNORMAL HIGH (ref 275–295)

## 2022-06-29 MED ORDER — AZITHROMYCIN 250 MG PO TABS
500.0000 mg | ORAL_TABLET | Freq: Every day | ORAL | Status: DC
  Administered 2022-06-29 – 2022-06-30 (×2): 500 mg via ORAL
  Filled 2022-06-29 (×2): qty 2

## 2022-06-29 MED ORDER — INSULIN GLARGINE-YFGN 100 UNIT/ML ~~LOC~~ SOLN
10.0000 [IU] | Freq: Once | SUBCUTANEOUS | Status: AC
  Administered 2022-06-29: 10 [IU] via SUBCUTANEOUS
  Filled 2022-06-29: qty 0.1

## 2022-06-29 MED ORDER — CEFDINIR 300 MG PO CAPS
300.0000 mg | ORAL_CAPSULE | Freq: Two times a day (BID) | ORAL | Status: DC
  Administered 2022-06-29 – 2022-06-30 (×3): 300 mg via ORAL
  Filled 2022-06-29 (×3): qty 1

## 2022-06-29 NOTE — Discharge Instructions (Addendum)
ECHOCARDIOGRAM REPORT     Patient Name:   Jeff White Date of Exam: 06/28/2022  Medical Rec #:  [id number]       Height:       70.0 in  Accession #:    [id number]      Weight:       187.9 lb  Date of Birth:  12-23-1945       BSA:          2.033 m  Patient Age:    76 years        BP:           133/72 mmHg  Patient Gender: M               HR:           85 bpm.  Exam Location:  Inpatient   Procedure: 2D Echo, Cardiac Doppler, Color Doppler and Intracardiac             Opacification Agent   Indications:    Elevated troponin     History:        Patient has no prior history of Echocardiogram  examinations.     Sonographer:    Jefferey Pica  Referring Phys: [place]      Sonographer Comments: Technically difficult due to respiratory  interference.  IMPRESSIONS     1. Very limited study due to poor visualization despite use of definity  contrast   2. Frequent PVCs throughout study   3. Left ventricular ejection fraction difficult to assess due to very  frequent ventricular ectopy and poor visualization, but, appears to be ~40  to 45%. The left ventricle has mildly decreased function. Left ventricular  endocardial border not optimally  defined to evaluate regional wall motion but appear global based on  limited views. Left ventricular diastolic parameters are consistent with  Grade II diastolic dysfunction (pseudonormalization).   4. Right ventricular systolic function is normal. The right ventricular  size is normal.   5. The mitral valve is grossly normal. Trivial mitral valve  regurgitation. No evidence of mitral stenosis.   6. The aortic valve was not well visualized. Aortic valve regurgitation  is not visualized. Aortic valve sclerosis/calcification is present,  without any evidence of aortic stenosis.   7. The inferior vena cava is normal in size with greater than 50%  respiratory variability, suggesting right atrial pressure of 3 mmHg.   Comparison(s):  No prior Echocardiogram.   FINDINGS   Left Ventricle: Left ventricular ejection fraction, by estimation, is 40  to 45%. The left ventricle has mildly decreased function. Left ventricular  endocardial border not optimally defined to evaluate regional wall motion.  The left ventricular internal  cavity size was normal in size. There is no left ventricular hypertrophy.  Left ventricular diastolic parameters are consistent with Grade II  diastolic dysfunction (pseudonormalization).   Right Ventricle: The right ventricular size is normal. No increase in  right ventricular wall thickness. Right ventricular systolic function is  normal.   Left Atrium: Left atrial size was normal in size.   Right Atrium: Right atrial size was normal in size.   Pericardium: There is no evidence of pericardial effusion.   Mitral Valve: The mitral valve is grossly normal. Trivial mitral valve  regurgitation. No evidence of mitral valve stenosis.   Tricuspid Valve: The tricuspid valve is normal in structure. Tricuspid  valve regurgitation is not demonstrated.   Aortic Valve: The aortic valve was not well  visualized. Aortic valve  regurgitation is not visualized. Aortic valve sclerosis/calcification is  present, without any evidence of aortic stenosis. Aortic valve peak  gradient measures 5.9 mmHg.   Pulmonic Valve: The pulmonic valve was not well visualized.   Aorta: The aortic root and ascending aorta are structurally normal, with  no evidence of dilitation.   Venous: The inferior vena cava is normal in size with greater than 50%  respiratory variability, suggesting right atrial pressure of 3 mmHg.   IAS/Shunts: The atrial septum is grossly normal.      LEFT VENTRICLE  PLAX 2D  LVIDd:         5.00 cm  LVIDs:         4.40 cm  LV PW:         1.10 cm  LV IVS:        1.00 cm  LVOT diam:     2.20 cm  LV SV:         84  LV SV Index:   42  LVOT Area:     3.80 cm      RIGHT VENTRICLE          IVC   RV Basal diam:  3.00 cm  IVC diam: 1.50 cm   LEFT ATRIUM             Index        RIGHT ATRIUM           Index  LA diam:        4.10 cm 2.02 cm/m   RA Area:     16.30 cm  LA Vol (A2C):   65.2 ml 32.08 ml/m  RA Volume:   43.50 ml  21.40 ml/m  LA Vol (A4C):   50.8 ml 24.99 ml/m  LA Biplane Vol: 58.0 ml 28.53 ml/m   AORTIC VALVE  AV Area (Vmax): 3.49 cm  AV Vmax:        121.00 cm/s  AV Peak Grad:   5.9 mmHg  LVOT Vmax:      111.00 cm/s  LVOT Vmean:     71.350 cm/s  LVOT VTI:       0.222 m     AORTA  Ao Root diam: 3.30 cm  Ao Asc diam:  3.20 cm      SHUNTS  Systemic VTI:  0.22 m  Systemic Diam: 2.20 cm   Gwyndolyn Kaufman MD  Electronically signed by Gwyndolyn Kaufman MD  Signature Date/Time: 06/28/2022/2:51:19 PM

## 2022-06-29 NOTE — Progress Notes (Addendum)
Initial Nutrition Assessment  DOCUMENTATION CODES:   Not applicable  INTERVENTION:   -Magic cup TID with meals, each supplement provides 290 kcal and 9 grams of protein  -MVI with minerals daily -Discussed recommendations with MD and DM coordinator via secure chat; pt remains with persistent hypoglycemia  NUTRITION DIAGNOSIS:   Increased nutrient needs related to chronic illness (COPD) as evidenced by estimated needs.  GOAL:   Patient will meet greater than or equal to 90% of their needs  MONITOR:   PO intake, Supplement acceptance  REASON FOR ASSESSMENT:   Consult Assessment of nutrition requirement/status  ASSESSMENT:   Pt with medical history significant of COPD, CHF, a.fib on Eliquis, DM 2, HLD, tobacco abuse, HTN admitted with sepsis, CAP, and respiratory failure.  Pt admitted with acute respiratory failure with hypoxia, CAP, and sepsis.   Reviewed I/O's: +890 ml x 24 hours and +1.2 L since admission  Pt unavailable at time of visit. Attempted to speak with pt via call to hospital room phone, however, unable to reach. RD unable to obtain further nutrition-related history or complete nutrition-focused physical exam at this time.    Pt currently on a carb modified diet. No meal completion data available to assess at this time.   Reviewed office visit from [facility] on 06/04/22. Pt was 197# at that visit. Pt has experienced a 4.3% wt loss over the past month. While this is not concerning for time frame, it is concerning given pt's increased nutritional needs for COPD. Pt would greatly benefit from addition of oral nutrition supplements.   Medications reviewed and include prednisone.   Lab Results  Component Value Date   HGBA1C 7.0 (H) 06/28/2022   PTA DM medications are 500 mg metformin daily.   Labs reviewed: CBGS: [id number] (inpatient orders for glycemic control are 0-15 units insulin aspart TID with meals and 0-5 units insulin aspart daily at  bedtime). Per DM coordinator note, recommending 10 units insulin glargine-yfgn daily and 4 units insulin aspart TID.  Diet Order:   Diet Order             Diet Carb Modified Fluid consistency: Thin; Room service appropriate? Yes  Diet effective now                   EDUCATION NEEDS:   No education needs have been identified at this time  Skin:  Skin Assessment: Reviewed RN Assessment  Last BM:  Unknown  Height:   Ht Readings from Last 1 Encounters:  06/28/22 5\' 10"  (1.778 m)    Weight:   Wt Readings from Last 1 Encounters:  06/28/22 85.2 kg    Ideal Body Weight:  75.5 kg  BMI:  Body mass index is 26.96 kg/m.  Estimated Nutritional Needs:   Kcal:  2050-2250  Protein:  100-115 grams  Fluid:  > 2 L    Loistine Chance, RD, LDN, [place] Registered Dietitian II Certified Diabetes Care and Education Specialist Please refer to [facility] for RD and/or RD on-call/weekend/after hours pager

## 2022-06-29 NOTE — Progress Notes (Signed)
Mobility Specialist - Progress Note   06/29/22 1347  Mobility  Activity Ambulated independently in hallway  Activity Response Tolerated well  Distance Ambulated (ft) 425 ft  $Mobility charge 1 Mobility  Level of Assistance Independent  Assistive Device None  HOB Elevated/Bed Position Self regulated  Range of Motion/Exercises Active  Transport method Ambulatory  Mobility Referral Yes   Pt received in bed and agreeable to mobility. Pt only uses O2 to ambulated. Pt took 2 standing breaking during mobility session. Pt to bed after session with all needs met.     Pre-mobility: 86% SpO2(no O2 on) During mobility: 94% SpO2 Post-mobility: 96% SPO2  Set designer

## 2022-06-29 NOTE — Progress Notes (Signed)
Patient declines nocturnal CPAP tonight. Equipment  remains at the bedside.

## 2022-06-29 NOTE — Inpatient Diabetes Management (Addendum)
Inpatient Diabetes Program Recommendations  AACE/ADA: New Consensus Statement on Inpatient Glycemic Control (2015)  Target Ranges:  Prepandial:   less than 140 mg/dL      Peak postprandial:   less than 180 mg/dL (1-2 hours)      Critically ill patients:  140 - 180 mg/dL   Lab Results  Component Value Date   GLUCAP 249 (H) 06/29/2022   HGBA1C 7.0 (H) 06/28/2022    Review of Glycemic Control  Latest Reference Range & Units 06/28/22 16:38 06/28/22 21:21 06/29/22 07:34  Glucose-Capillary 70 - 99 mg/dL 243 (H) 322 (H) 249 (H)  (H): Data is abnormally high Diabetes history: Type 2 DM Outpatient Diabetes medications: Metformin 500 mg QD Current orders for Inpatient glycemic control: Novolog 0-15 units TID & HS Prednisone 40 mg QD  Inpatient Diabetes Program Recommendations:    Consider adding Semglee 10 units QD and Novolog 4 units TID (assuming patient consuming >50% of meals).   Spoke with patient regarding outpatient diabetes management.  Reviewed patient's current A1c of 7%. Explained what a A1c is and what it measures. Also reviewed goal A1c with patient, importance of good glucose control @ home, and blood sugar goals. Reviewed patho of DM, need for good control, survival skills, symptoms, interventions, Metformin, impact of steroids, vascular changes and commorbidities.  Patient does not check CBGs at home. Could consider ordering meter kit [id number]. Encouraged PCP follow up.  Patient tries to mindful of CHO intake. Denies drinking sugary beverages or consuming large amounts of sweets. Encouraged continued mindfulness.  No further questions at this time.     Thanks, Bronson Curb, MSN, RNC-OB Diabetes Coordinator [phone] (8a-5p)

## 2022-06-29 NOTE — Progress Notes (Signed)
Mobility Specialist - Progress Note   06/29/22 1030  Mobility  Activity Ambulated independently in hallway  Activity Response Tolerated well  Distance Ambulated (ft) 425 ft  $Mobility charge 1 Mobility  Level of Assistance Independent  Assistive Device None  HOB Elevated/Bed Position Self regulated  Range of Motion/Exercises Active  Transport method Ambulatory  Mobility Referral No   Pt received in bed and agreeable to mobility. C/o feeling SOB during mobility.  Attempted to walk pt on no O2, while watching O2 levels. Pt dropped to 88% O2, so supplemental O2 was given for the remainder of the session. Pt was able to recover to 99% O2. Pt to bed after session with all needs met.     Pre-mobility: 96% SpO2 During mobility: 88% SpO2 Post-mobility: 93%-99% SPO2  Set designer

## 2022-06-29 NOTE — Progress Notes (Signed)
SATURATION QUALIFICATIONS: (This note is used to comply with regulatory documentation for home oxygen)  Patient Saturations on Room Air at Rest = 94%  Patient Saturations on Room Air while Ambulating = 86%  Patient Saturations on 2 Liters of oxygen while Ambulating = 93%  Please briefly explain why patient needs home oxygen: Pts oxygen saturation drops while ambulating and requires oxygen

## 2022-06-29 NOTE — Progress Notes (Signed)
[facility] PROGRESS NOTE   DIOGO ANNE CVE:[id number] DOB: 1945-10-25 DOA: 06/27/2022  PCP: Drucilla Chalet, MD  Brief History/Interval Summary: 76 y.o. male with medical history significant of COPD, CHF, a.fib on Eliquis, DM 2, HLD, tobacco abuse, HTN presented with shortness of breath.  He is originally from [facility] and was planning to get back home when his symptoms started.  Imaging studies suggested pneumonia.  Patient did have low-grade fever.  He was hospitalized for further management.   Consultants: None  Procedures: None yet    Subjective/Interval History: Patient mentioned that he continues to improve.  Still has cough with clear expectoration.  Also noted some wheezing this morning.  Denies any nausea vomiting.  Has been ambulating.       Assessment/Plan:  Community-acquired pneumonia/acute respiratory failure with hypoxia/sepsis present on admission WBC was noted to be elevated.  He was tachycardic and had tachypnea. Imaging studies raised concern for pneumonia.  Influenza PCR and COVID-19 tests was negative.  Respiratory viral panel is negative.  No PE noted on CT angiogram. Patient was started on ceftriaxone and azithromycin. Patient is a chronic smoker which is likely contributing to his symptoms. Initially was on 4 L of oxygen by nasal cannula.  Now on 2 L.  Continue to wean to maintain saturations greater than 90%. Continue with flutter valve and incentive spirometry Patient appears to be gradually improving.  Elevated WBC is due to steroids.  He is afebrile. We will change him over to oral antibiotics.  We will switch him to [facility] and azithromycin orally. Hopefully he will not need home oxygen therapy.  Coronary artery disease Followed by cardiology near [facility]. History of coronary artery stents previously.   Elevated troponin levels noted but likely due to demand ischemia.  Patient denies any chest pain. No ischemic changes noted on  EKG. Echocardiogram does show diminished left ventricular EF of 40 to 45% although the study was limited due to technical reasons.  Wall motion could not be ascertained. Despite decreased EF noted on echo I feel that his elevated troponin was most likely due to demand ischemia.  He is established with a cardiologist in [facility].  No need to involve cardiology here since he does not have any ischemic symptoms. Continue with aspirin, statin.  Not noted to be on beta-blocker.  Paroxysmal atrial fibrillation Currently in sinus rhythm.  Apixaban being continued.  Not noted to be on any rate limiting drugs. Status post ablation in 2021.  Chronic combined systolic and diastolic CHF According to care everywhere and based on his cardiologist notes it appears that his EF was 50 to 55% in April 2021.  And then a subsequent nuclear stress test showed his EF to be 41%.   ACE inhibitor was placed on hold due to elevated creatinine Echocardiogram shows EF of 40 to 45% though the study was technically limited. Multiple PVCs were noted.  On telemetry.  Patient started on low-dose carvedilol which she seems to be tolerating well. As mentioned above he is followed by Dr. Wyonia Hough who is a cardiologist in [facility]. Patient is fairly euvolemic.  Was not taking any diuretics prior to admission.  No clear indication to initiate at this time.  Chronic kidney disease stage IIIb Renal function is stable.  Potassium level noted to be normal today.  Lisinopril is on hold.  Avoid nephrotoxic agents.    COPD with concern for acute exacerbation/tobacco abuse Continues to smoke cigarettes.  He was  counseled.  Patient remains on steroids.  These have been changed over to oral today.  Continue with nebulizer treatments.  Budesonide nebulized as well. Noted to be on Anoro Ellipta prior to admission.  This can be resumed at discharge. Counseled about his smoking.  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  SSI.  Noted to be on metformin prior to admission which is currently on hold. HbA1c is 7.0 Elevated CBGs due to steroids.  We will give him a dose of Lantus today.  Dose of steroid was decreased today so anticipate some improvement in his glucose levels going forward.  Essential hypertension Lisinopril on hold.  Blood pressure is reasonably well controlled.  Obstructive sleep apnea Continue CPAP  DVT Prophylaxis: On apixaban Code Status: Full code Family Communication: Discussed with the patient Disposition Plan: Hopefully return home when improved.  Anticipate discharge tomorrow.  Status is: Inpatient Remains inpatient appropriate because: Acute respiratory failure with hypoxia      Medications: Scheduled:  apixaban  5 mg Oral BID   aspirin EC  81 mg Oral Daily   atorvastatin  20 mg Oral Daily   budesonide (PULMICORT) nebulizer solution  0.25 mg Nebulization BID   carvedilol  3.125 mg Oral BID WC   guaiFENesin  600 mg Oral BID   insulin aspart  0-15 Units Subcutaneous TID WC   insulin aspart  0-5 Units Subcutaneous QHS   ipratropium-albuterol  3 mL Nebulization TID   nicotine  14 mg Transdermal Daily   pantoprazole  40 mg Oral Daily   predniSONE  40 mg Oral Q breakfast   Continuous:  azithromycin 500 mg (06/28/22 2341)   cefTRIAXone (ROCEPHIN)  IV 2 g (06/28/22 2147)   [id number] **OR** acetaminophen, albuterol, HYDROcodone-acetaminophen, mouth rinse  Antibiotics: Anti-infectives (From admission, onward)    Start     Dose/Rate Route Frequency Ordered Stop   06/28/22 2359  azithromycin (ZITHROMAX) 500 mg in sodium chloride 0.9 % 250 mL IVPB        500 mg 250 mL/hr over 60 Minutes Intravenous Every 24 hours 06/28/22 0035 07/03/22 2359   06/28/22 2200  cefTRIAXone (ROCEPHIN) 2 g in sodium chloride 0.9 % 100 mL IVPB        2 g 200 mL/hr over 30 Minutes Intravenous Every 24 hours 06/28/22 0035  07/03/22 2159   06/27/22 2145  cefTRIAXone (ROCEPHIN) 1 g in sodium chloride 0.9 % 100 mL IVPB        1 g 200 mL/hr over 30 Minutes Intravenous  Once 06/27/22 2143 06/27/22 2336   06/27/22 2145  azithromycin (ZITHROMAX) 500 mg in sodium chloride 0.9 % 250 mL IVPB        500 mg 250 mL/hr over 60 Minutes Intravenous  Once 06/27/22 2143 06/28/22 0128       Objective:  Vital Signs  Vitals:   06/29/22 0001 06/29/22 0438 06/29/22 0724 06/29/22 0742  BP:  127/66  127/70  Pulse:  75    Resp: 17 20  20   Temp:  QA348G F (36.4 C)    TempSrc:  Oral    SpO2:  94% 99% 97%  Weight:      Height:        Intake/Output Summary (Last 24 hours) at 06/29/2022 1053 Last data filed at 06/29/2022 0900 Gross per 24 hour  Intake 710 ml  Output --  Net 710 ml    Filed Weights   06/27/22 2047 06/28/22 0214  Weight: 84.4 kg 85.2 kg    General appearance: Awake alert.  In no distress Resp: Scattered wheezing is appreciated.  Normal effort at rest.  few crackles at the bases bilaterally. Cardio: S1-S2 is normal regular.  No S3-S4.  No rubs murmurs or bruit.  Premature beats heard.  Telemetry shows multiple PVCs. GI: Abdomen is soft.  Nontender nondistended.  Bowel sounds are present normal.  No masses organomegaly Extremities: No edema.  Full range of motion of lower extremities. Neurologic: Alert and oriented x3.  No focal neurological deficits.     Lab Results:  Data Reviewed: I have personally reviewed following labs and reports of the imaging studies  CBC: Recent Labs  Lab 06/27/22 2108 06/28/22 0631 06/29/22 0502  WBC 13.1* 13.3* 15.4*  NEUTROABS  --  11.8*  --   HGB 14.0 13.1 13.3  HCT 43.3 41.0 42.7  MCV 101.4* 102.2* 104.1*  PLT 249 228 272     Basic Metabolic Panel: Recent Labs  Lab 06/27/22 2108 06/28/22 0631 06/28/22 0632 06/28/22 0648 06/29/22 0502  NA 141  --  141 140 143  K 4.9  --  5.1 5.2* 4.7  CL 106  --  108 107 107  CO2 23  --  25 27 28   GLUCOSE 349*   --  335* 337* 286*  BUN 36*  --  35* 36* 45*  CREATININE 1.75*  --  1.62* 1.61* 1.61*  CALCIUM 9.0  --  8.5* 8.6* 8.9  MG  --  2.1  --   --   --   PHOS  --  4.2  --   --   --      GFR: Estimated Creatinine Clearance: 41.6 mL/min (A) (by C-G formula based on SCr of 1.61 mg/dL (H)).  Liver Function Tests: Recent Labs  Lab 06/28/22 0631 06/28/22 0632  AST 29 28  ALT 49* 50*  ALKPHOS 90 92  BILITOT 0.7 0.5  PROT 7.1 7.1  ALBUMIN 3.4* 3.4*      Coagulation Profile: Recent Labs  Lab 06/27/22 2108  INR 1.7*     Cardiac Enzymes: Recent Labs  Lab 06/28/22 0631  CKTOTAL 110     HbA1C: Recent Labs    06/28/22 0631  HGBA1C 7.0*     CBG: Recent Labs  Lab 06/28/22 0731 06/28/22 1134 06/28/22 1638 06/28/22 2121 06/29/22 0734  GLUCAP 321* 269* 243* 322* 249*     Lipid Profile: Recent Labs    06/28/22 0631  CHOL 78  HDL 26*  LDLCALC 42  TRIG 52  CHOLHDL 3.0     Thyroid Function Tests: Recent Labs    06/28/22 0631  TSH 0.724      Recent Results (from the past 240 hour(s))  Resp Panel by RT-PCR (Flu A&B, Covid) Anterior Nasal Swab     Status: None   Collection Time: 06/27/22  8:54 PM   Specimen: Anterior Nasal Swab  Result Value Ref Range Status   SARS Coronavirus 2 by RT PCR NEGATIVE NEGATIVE Final    Comment: (NOTE) SARS-CoV-2 target nucleic acids are NOT DETECTED.  The SARS-CoV-2 RNA is generally detectable in upper respiratory specimens during the acute phase of infection. The lowest concentration of SARS-CoV-2 viral copies this assay can detect is 138 copies/mL. A negative result does not preclude SARS-Cov-2 infection and should not be used as the sole basis for treatment or other patient management decisions. A negative result may occur with  improper specimen collection/handling, submission of specimen other  than nasopharyngeal swab, presence of viral mutation(s) within the areas targeted by this assay, and inadequate number of  viral copies(<138 copies/mL). A negative result must be combined with clinical observations, patient history, and epidemiological information. The expected result is Negative.  Fact Sheet for Patients:  EntrepreneurPulse.com.au  Fact Sheet for Healthcare Providers:  IncredibleEmployment.be  This test is no t yet approved or cleared by the [place] FDA and  has been authorized for detection and/or diagnosis of SARS-CoV-2 by FDA under an Emergency Use Authorization (EUA). This EUA will remain  in effect (meaning this test can be used) for the duration of the COVID-19 declaration under Section 564(b)(1) of the Act, 21 U.S.C.section 360bbb-3(b)(1), unless the authorization is terminated  or revoked sooner.       Influenza A by PCR NEGATIVE NEGATIVE Final   Influenza B by PCR NEGATIVE NEGATIVE Final    Comment: (NOTE) The Xpert Xpress SARS-CoV-2/FLU/RSV plus assay is intended as an aid in the diagnosis of influenza from Nasopharyngeal swab specimens and should not be used as a sole basis for treatment. Nasal washings and aspirates are unacceptable for Xpert Xpress SARS-CoV-2/FLU/RSV testing.  Fact Sheet for Patients: EntrepreneurPulse.com.au  Fact Sheet for Healthcare Providers: IncredibleEmployment.be  This test is not yet approved or cleared by the [place] FDA and has been authorized for detection and/or diagnosis of SARS-CoV-2 by FDA under an Emergency Use Authorization (EUA). This EUA will remain in effect (meaning this test can be used) for the duration of the COVID-19 declaration under Section 564(b)(1) of the Act, 21 U.S.C. section 360bbb-3(b)(1), unless the authorization is terminated or revoked.  Performed at [facility], [place] [address] [place], [place] [address]   Blood culture (routine x 2)     Status: None (Preliminary result)   Collection Time: 06/27/22  9:11  PM   Specimen: BLOOD  Result Value Ref Range Status   Specimen Description   Final    BLOOD RIGHT ANTECUBITAL Performed at [place] [address], [place], [place] [address]    Special Requests   Final    BOTTLES DRAWN AEROBIC AND ANAEROBIC Blood Culture results may not be optimal due to an inadequate volume of blood received in culture bottles   Culture   Final    NO GROWTH 1 DAY Performed at [facility], [place] [address], [place], [place] [address]    Report Status PENDING  Incomplete  Blood culture (routine x 2)     Status: None (Preliminary result)   Collection Time: 06/27/22 10:17 PM   Specimen: BLOOD RIGHT FOREARM  Result Value Ref Range Status   Specimen Description   Final    BLOOD RIGHT FOREARM Performed at [place] [address], [place], [place] [address]    Special Requests   Final    BOTTLES DRAWN AEROBIC AND ANAEROBIC Blood Culture adequate volume Performed at [place] [address], [place], [place] [address]    Culture   Final    NO GROWTH 1 DAY Performed at [facility], [place] [address], [place], [place] [address]    Report Status PENDING  Incomplete  Respiratory (~20 pathogens) panel by PCR     Status: None   Collection Time: 06/28/22  1:45 AM   Specimen: Nasopharyngeal Swab; Respiratory  Result Value Ref Range Status   Adenovirus NOT DETECTED NOT DETECTED Final   Coronavirus 229E NOT DETECTED NOT DETECTED Final  Comment: (NOTE) The Coronavirus on the Respiratory Panel, DOES NOT test for the novel  Coronavirus (2019 nCoV)    Coronavirus HKU1 NOT DETECTED NOT DETECTED Final   Coronavirus NL63 NOT DETECTED NOT DETECTED Final   Coronavirus OC43 NOT DETECTED NOT DETECTED Final   Metapneumovirus NOT DETECTED NOT DETECTED Final   Rhinovirus / Enterovirus NOT DETECTED NOT DETECTED Final   Influenza A NOT DETECTED NOT DETECTED Final   Influenza B NOT DETECTED NOT DETECTED  Final   Parainfluenza Virus 1 NOT DETECTED NOT DETECTED Final   Parainfluenza Virus 2 NOT DETECTED NOT DETECTED Final   Parainfluenza Virus 3 NOT DETECTED NOT DETECTED Final   Parainfluenza Virus 4 NOT DETECTED NOT DETECTED Final   Respiratory Syncytial Virus NOT DETECTED NOT DETECTED Final   Bordetella pertussis NOT DETECTED NOT DETECTED Final   Bordetella Parapertussis NOT DETECTED NOT DETECTED Final   Chlamydophila pneumoniae NOT DETECTED NOT DETECTED Final   Mycoplasma pneumoniae NOT DETECTED NOT DETECTED Final    Comment: Performed at [facility], [place] [address], [place], [place] [address]  MRSA Next Gen by PCR, Nasal     Status: None   Collection Time: 06/28/22  2:34 AM   Specimen: Nasal Mucosa; Nasal Swab  Result Value Ref Range Status   MRSA by PCR Next Gen NOT DETECTED NOT DETECTED Final    Comment: (NOTE) The GeneXpert MRSA Assay (FDA approved for NASAL specimens only), is one component of a comprehensive MRSA colonization surveillance program. It is not intended to diagnose MRSA infection nor to guide or monitor treatment for MRSA infections. Test performance is not FDA approved in patients less than 43 years old. Performed at [facility], [place] [address] [place], [place] [address]       Radiology Studies: ECHOCARDIOGRAM COMPLETE  Result Date: 06/28/2022    ECHOCARDIOGRAM REPORT   Patient Name:   Jeff White Date of Exam: 06/28/2022 Medical Rec #:  [id number]       Height:       70.0 in Accession #:    [id number]      Weight:       187.9 lb Date of Birth:  03/05/1946       BSA:          2.033 m Patient Age:    46 years        BP:           133/72 mmHg Patient Gender: M               HR:           85 bpm. Exam Location:  Inpatient Procedure: 2D Echo, Cardiac Doppler, Color Doppler and Intracardiac            Opacification Agent Indications:    Elevated troponin  History:        Patient has no prior history of Echocardiogram examinations.   Sonographer:    Jefferey Pica Referring Phys: [place]  Sonographer Comments: Technically difficult due to respiratory interference. IMPRESSIONS  1. Very limited study due to poor visualization despite use of definity contrast  2. Frequent PVCs throughout study  3. Left ventricular ejection fraction difficult to assess due to very frequent ventricular ectopy and poor visualization, but, appears to be ~40 to 45%. The left ventricle has mildly decreased function. Left ventricular endocardial border not optimally defined to evaluate regional wall motion but appear global based on limited views. Left ventricular diastolic parameters are  consistent with Grade II diastolic dysfunction (pseudonormalization).  4. Right ventricular systolic function is normal. The right ventricular size is normal.  5. The mitral valve is grossly normal. Trivial mitral valve regurgitation. No evidence of mitral stenosis.  6. The aortic valve was not well visualized. Aortic valve regurgitation is not visualized. Aortic valve sclerosis/calcification is present, without any evidence of aortic stenosis.  7. The inferior vena cava is normal in size with greater than 50% respiratory variability, suggesting right atrial pressure of 3 mmHg. Comparison(s): No prior Echocardiogram. FINDINGS  Left Ventricle: Left ventricular ejection fraction, by estimation, is 40 to 45%. The left ventricle has mildly decreased function. Left ventricular endocardial border not optimally defined to evaluate regional wall motion. The left ventricular internal cavity size was normal in size. There is no left ventricular hypertrophy. Left ventricular diastolic parameters are consistent with Grade II diastolic dysfunction (pseudonormalization). Right Ventricle: The right ventricular size is normal. No increase in right ventricular wall thickness. Right ventricular systolic function is normal. Left Atrium: Left atrial size was normal in size. Right Atrium:  Right atrial size was normal in size. Pericardium: There is no evidence of pericardial effusion. Mitral Valve: The mitral valve is grossly normal. Trivial mitral valve regurgitation. No evidence of mitral valve stenosis. Tricuspid Valve: The tricuspid valve is normal in structure. Tricuspid valve regurgitation is not demonstrated. Aortic Valve: The aortic valve was not well visualized. Aortic valve regurgitation is not visualized. Aortic valve sclerosis/calcification is present, without any evidence of aortic stenosis. Aortic valve peak gradient measures 5.9 mmHg. Pulmonic Valve: The pulmonic valve was not well visualized. Aorta: The aortic root and ascending aorta are structurally normal, with no evidence of dilitation. Venous: The inferior vena cava is normal in size with greater than 50% respiratory variability, suggesting right atrial pressure of 3 mmHg. IAS/Shunts: The atrial septum is grossly normal.  LEFT VENTRICLE PLAX 2D LVIDd:         5.00 cm LVIDs:         4.40 cm LV PW:         1.10 cm LV IVS:        1.00 cm LVOT diam:     2.20 cm LV SV:         84 LV SV Index:   42 LVOT Area:     3.80 cm  RIGHT VENTRICLE          IVC RV Basal diam:  3.00 cm  IVC diam: 1.50 cm LEFT ATRIUM             Index        RIGHT ATRIUM           Index LA diam:        4.10 cm 2.02 cm/m   RA Area:     16.30 cm LA Vol (A2C):   65.2 ml 32.08 ml/m  RA Volume:   43.50 ml  21.40 ml/m LA Vol (A4C):   50.8 ml 24.99 ml/m LA Biplane Vol: 58.0 ml 28.53 ml/m  AORTIC VALVE AV Area (Vmax): 3.49 cm AV Vmax:        121.00 cm/s AV Peak Grad:   5.9 mmHg LVOT Vmax:      111.00 cm/s LVOT Vmean:     71.350 cm/s LVOT VTI:       0.222 m  AORTA Ao Root diam: 3.30 cm Ao Asc diam:  3.20 cm  SHUNTS Systemic VTI:  0.22 m Systemic Diam: 2.20 cm Gwyndolyn Kaufman MD Electronically  signed by Gwyndolyn Kaufman MD Signature Date/Time: 06/28/2022/2:51:19 PM    Final    CT Angio Chest PE W and/or Wo Contrast  Result Date: 06/27/2022 CLINICAL DATA:   Shortness of breath for several days with fevers EXAM: CT ANGIOGRAPHY CHEST WITH CONTRAST TECHNIQUE: Multidetector CT imaging of the chest was performed using the standard protocol during bolus administration of intravenous contrast. Multiplanar CT image reconstructions and MIPs were obtained to evaluate the vascular anatomy. RADIATION DOSE REDUCTION: This exam was performed according to the departmental dose-optimization program which includes automated exposure control, adjustment of the mA and/or kV according to patient size and/or use of iterative reconstruction technique. CONTRAST:  1mL OMNIPAQUE IOHEXOL 350 MG/ML SOLN COMPARISON:  Chest x-ray from earlier in the same day. FINDINGS: Cardiovascular: Thoracic aorta shows atherosclerotic calcifications. No aneurysmal dilatation or dissection is noted. No cardiac enlargement is seen. Coronary calcifications are noted. The pulmonary artery shows a normal branching pattern bilaterally. No filling defect to suggest pulmonary embolism is noted. Mediastinum/Nodes: Thoracic inlet is within normal limits. No hilar or mediastinal adenopathy is noted. Scattered small mediastinal nodes are seen likely reactive in nature. The esophagus is within normal limits. Lungs/Pleura: Somewhat limited by patient motion artifact. Diffuse tree-in-bud nodularity is noted throughout both lungs consistent with inflammatory change. No sizable effusion is noted. No sizable parenchymal nodules are seen. Upper Abdomen: Upper pole left renal cyst is noted which appears simple in nature. No further follow-up is recommended. Musculoskeletal: Degenerative changes of the thoracic spine are noted. Review of the MIP images confirms the above findings. IMPRESSION: Diffuse changes throughout both lungs consistent with underlying inflammatory change likely atypical pneumonia. No evidence of pulmonary emboli. Aortic Atherosclerosis (ICD10-I70.0). Electronically Signed   By: Inez Catalina M.D.   On:  06/27/2022 23:47   DG Chest 2 View  Result Date: 06/27/2022 CLINICAL DATA:  Shortness of breath EXAM: CHEST - 2 VIEW COMPARISON:  Radiographs 10/18/2010 FINDINGS: Mild bronchovascular crowding in the lung bases with bronchial wall thickening. No focal consolidation, pleural effusion, or pneumothorax. Normal cardiomediastinal silhouette. No acute osseous abnormality. IMPRESSION: Mild bronchitis.  No focal consolidation. Electronically Signed   By: Placido Sou M.D.   On: 06/27/2022 21:43       LOS: 1 day   [facility] Pager on [email or web address]  06/29/2022, 10:53 AM

## 2022-06-30 DIAGNOSIS — J189 Pneumonia, unspecified organism: Secondary | ICD-10-CM | POA: Diagnosis not present

## 2022-06-30 LAB — CBC
HCT: 41.3 % (ref 39.0–52.0)
Hemoglobin: 13 g/dL (ref 13.0–17.0)
MCH: 32.4 pg (ref 26.0–34.0)
MCHC: 31.5 g/dL (ref 30.0–36.0)
MCV: 103 fL — ABNORMAL HIGH (ref 80.0–100.0)
Platelets: 250 10*3/uL (ref 150–400)
RBC: 4.01 MIL/uL — ABNORMAL LOW (ref 4.22–5.81)
RDW: 12.5 % (ref 11.5–15.5)
WBC: 15.5 10*3/uL — ABNORMAL HIGH (ref 4.0–10.5)
nRBC: 0.1 % (ref 0.0–0.2)

## 2022-06-30 LAB — GLUCOSE, CAPILLARY
Glucose-Capillary: 202 mg/dL — ABNORMAL HIGH (ref 70–99)
Glucose-Capillary: 170 mg/dL — ABNORMAL HIGH (ref 70–99)

## 2022-06-30 LAB — BASIC METABOLIC PANEL
Anion gap: 8 (ref 5–15)
BUN: 46 mg/dL — ABNORMAL HIGH (ref 8–23)
CO2: 28 mmol/L (ref 22–32)
Calcium: 8.8 mg/dL — ABNORMAL LOW (ref 8.9–10.3)
Chloride: 107 mmol/L (ref 98–111)
Creatinine, Ser: 1.85 mg/dL — ABNORMAL HIGH (ref 0.61–1.24)
GFR, Estimated: 38 mL/min — ABNORMAL LOW (ref >60)
Glucose, Bld: 225 mg/dL — ABNORMAL HIGH (ref 70–99)
Potassium: 4.8 mmol/L (ref 3.5–5.1)
Sodium: 143 mmol/L (ref 135–145)

## 2022-06-30 MED ORDER — PREDNISONE 20 MG PO TABS: ORAL_TABLET | ORAL | 0 refills | Status: AC

## 2022-06-30 MED ORDER — CARVEDILOL 3.125 MG PO TABS: 3.1250 mg | ORAL_TABLET | Freq: Two times a day (BID) | ORAL | 0 refills | Status: AC

## 2022-06-30 MED ORDER — CEFDINIR 300 MG PO CAPS: 300.0000 mg | ORAL_CAPSULE | Freq: Two times a day (BID) | ORAL | 0 refills | Status: AC

## 2022-06-30 MED ORDER — GUAIFENESIN ER 600 MG PO TB12: 600.0000 mg | ORAL_TABLET | Freq: Two times a day (BID) | ORAL | 0 refills | Status: AC

## 2022-06-30 MED ORDER — AZITHROMYCIN 500 MG PO TABS: 500.0000 mg | ORAL_TABLET | Freq: Every day | ORAL | 0 refills | Status: AC

## 2022-06-30 MED ORDER — NICOTINE 14 MG/24HR TD PT24: 14.0000 mg | MEDICATED_PATCH | Freq: Every day | TRANSDERMAL | 0 refills | Status: AC

## 2022-06-30 NOTE — Progress Notes (Signed)
Mobility Specialist - Progress Note   06/30/22 0957  Mobility  Activity Ambulated independently in hallway  Activity Response Tolerated well  Distance Ambulated (ft) 500 ft  $Mobility charge 1 Mobility  Level of Assistance Independent  Assistive Device None  HOB Elevated/Bed Position Self regulated  Range of Motion/Exercises Active  Transport method Ambulatory  Mobility Referral No   Pt received in bed and agreeable to mobility. Pt took 1 standing rest break during ambulation. Pt had no complaints during mobility. Pt to bed after session with all needs met.      Pre-mobility: 93% SpO2 During mobility: 93% SpO2 Post-mobility: 93% SPO2  Set designer

## 2022-06-30 NOTE — TOC Transition Note (Signed)
Transition of Care [facility]) - CM/SW Discharge Note   Patient Details  Name: Jeff White MRN: [id number] Date of Birth: 04/30/1946  [facility]) CM/SW Contact:  Dessa Phi, RN Phone Number: 06/30/2022, 9:57 AM   Clinical Narrative:  d/c home w/home 02-patient agree to [facility] for home 02-rep Danielle to deliver travel tank to rm prior d/c. Lives in [place] aware of address for delivery of home 02 set up. Has own transport home. No further CM needs.     Final next level of care: Home/Self Care Barriers to Discharge: No Barriers Identified   Patient Goals and CMS Choice Patient states their goals for this hospitalization and ongoing recovery are::  (Home) CMS Medicare.gov Compare Post Acute Care list provided to:: Patient Choice offered to / list presented to : Patient  Discharge Placement                       Discharge Plan and Services   Discharge Planning Services: CM Consult Post Acute Care Choice: Durable Medical Equipment          DME Arranged: Oxygen DME Agency: [facility] Date DME Agency Contacted: 06/30/22 Time DME Agency Contacted: [phone] Representative spoke with at DME Agency: Andee Poles            Social Determinants of Health (SDOH) Interventions Housing Interventions: Intervention Not Indicated   Readmission Risk Interventions     No data to display

## 2022-06-30 NOTE — Discharge Summary (Signed)
[facility]  Physician Discharge Summary   Patient ID: Jeff White MRN: [id number] DOB/AGE: [id number] 74 y.o.  Admit date: 06/27/2022 Discharge date:   06/30/2022   PCP: Drucilla Chalet, MD  DISCHARGE DIAGNOSES:    Sepsis ([place])   CAP (community acquired pneumonia)   CKD (chronic kidney disease), stage III ([place])   Paroxysmal A-fib ([place])   CAD (coronary artery disease)   COPD with acute exacerbation ([place])   Hyperlipidemia   DM (diabetes mellitus), type 2 with complications ([place])   Benign essential HTN   Tobacco abuse   Elevated troponin   Chronic combined systolic and diastolic CHF (congestive heart failure) ([facility])   OSA (obstructive sleep apnea)   Acute respiratory failure with hypoxia ([place])   RECOMMENDATIONS FOR OUTPATIENT FOLLOW UP: Patient promises to follow-up with his primary care provider when he is back in [place] within the next few days. Home oxygen has been ordered    Home Health: None Equipment/Devices: Home oxygen  CODE STATUS: Full code  DISCHARGE CONDITION: fair  Diet recommendation: As before  INITIAL HISTORY: 76 y.o. male with medical history significant of COPD, CHF, a.fib on Eliquis, DM 2, HLD, tobacco abuse, HTN presented with shortness of breath.  He is originally from [facility] and was planning to get back home when his symptoms started.  Imaging studies suggested pneumonia.  Patient did have low-grade fever.  He was hospitalized for further management.    Consultants: None   Procedures: None     HOSPITAL COURSE:   Community-acquired pneumonia/acute respiratory failure with hypoxia/sepsis present on admission At the time of admission WBC was noted to be elevated.  He was tachycardic and had tachypnea. Imaging studies raised concern for pneumonia.  Influenza PCR and COVID-19 tests was negative.  Respiratory viral panel is negative.  No PE noted on CT angiogram. Patient was started on ceftriaxone and  azithromycin. Patient is a chronic smoker which is likely contributing to his symptoms. Initially was on 4 L of oxygen by nasal cannula.  Now on 2 L.  Will need home oxygen as he tends to desaturate with ambulation. Patient has improved.  Will be transitioned to oral antibiotics including Omnicef and azithromycin.  Home oxygen to be arranged.  Continue with flutter valve and incentive spirometry at home.  Patient plans to follow-up with his primary care provider within the next few days after he returns to [place].   Coronary artery disease Followed by cardiology near [facility]. History of coronary artery stents previously.   Elevated troponin levels noted but likely due to demand ischemia.  Patient denies any chest pain. No ischemic changes noted on EKG. Echocardiogram does show diminished left ventricular EF of 40 to 45% although the study was limited due to technical reasons.  Wall motion could not be ascertained. Despite decreased EF noted on echo I feel that his elevated troponin was most likely due to demand ischemia.  He is established with a cardiologist in [facility].  No need to involve cardiology here since he does not have any ischemic symptoms. Continue with aspirin, statin.  Started on carvedilol as discussed below.   Paroxysmal atrial fibrillation Currently in sinus rhythm.  Apixaban being continued.   Status post ablation in 2021.   Chronic combined systolic and diastolic CHF According to care everywhere and based on his cardiologist notes it appears that his EF was 50 to 55% in April 2021.  And then a subsequent nuclear stress test showed his EF to  be 41%.   ACE inhibitor was placed on hold due to elevated creatinine Echocardiogram shows EF of 40 to 45% though the study was technically limited. Multiple PVCs were noted.  On telemetry.  Patient started on low-dose carvedilol which she seems to be tolerating well. As mentioned above he is followed by Dr. Wyonia Hough who is  a cardiologist in [facility]. Patient is fairly euvolemic.  Was not taking any diuretics prior to admission.  No clear indication to initiate at this time.   Chronic kidney disease stage IIIb Stable for the most part.  Continue to hold lisinopril.  Has good urine output.  May benefit from referral to nephrology for his CKD.   COPD with concern for acute exacerbation/tobacco abuse Continues to smoke cigarettes.  He was counseled.   He will be discharged on steroid taper.  No wheezing appreciated today. Noted to be on Anoro Ellipta prior to admission.  This can be resumed at discharge.   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  High glucose levels in the hospital due to steroids.  Was given a dose of Lantus yesterday.  He may resume his metformin at discharge.  Anticipate glucose levels to continue to improve as his prednisone dose is tapered off.     Essential hypertension Reasonably well-controlled.   Obstructive sleep apnea Continue CPAP    Patient is stable.  Feels well.  Wants to go home today.  Home oxygen has been ordered.  Seems to be stable for discharge.   PERTINENT LABS:  The results of significant diagnostics from this hospitalization (including imaging, microbiology, ancillary and laboratory) are listed below for reference.    Microbiology: Recent Results (from the past 240 hour(s))  Resp Panel by RT-PCR (Flu A&B, Covid) Anterior Nasal Swab     Status: None   Collection Time: 06/27/22  8:54 PM   Specimen: Anterior Nasal Swab  Result Value Ref Range Status   SARS Coronavirus 2 by RT PCR NEGATIVE NEGATIVE Final    Comment: (NOTE) SARS-CoV-2 target nucleic acids are NOT DETECTED.  The SARS-CoV-2 RNA is generally detectable in upper respiratory specimens during the acute phase of infection. The lowest concentration of SARS-CoV-2 viral copies this assay can detect is 138 copies/mL. A negative  result does not preclude SARS-Cov-2 infection and should not be used as the sole basis for treatment or other patient management decisions. A negative result may occur with  improper specimen collection/handling, submission of specimen other than nasopharyngeal swab, presence of viral mutation(s) within the areas targeted by this assay, and inadequate number of viral copies(<138 copies/mL). A negative result must be combined with clinical observations, patient history, and epidemiological information. The expected result is Negative.  Fact Sheet for Patients:  EntrepreneurPulse.com.au  Fact Sheet for Healthcare Providers:  IncredibleEmployment.be  This test is no t yet approved or cleared by the [place] FDA and  has been authorized for detection and/or diagnosis of SARS-CoV-2 by FDA under an Emergency Use Authorization (EUA). This EUA will remain  in effect (meaning this test can be used) for the duration of the COVID-19 declaration under Section 564(b)(1) of the Act, 21 U.S.C.section 360bbb-3(b)(1), unless the authorization is terminated  or revoked sooner.       Influenza A by PCR NEGATIVE NEGATIVE Final   Influenza B by PCR NEGATIVE NEGATIVE Final    Comment: (NOTE) The Xpert Xpress SARS-CoV-2/FLU/RSV plus assay is intended as an aid in the  diagnosis of influenza from Nasopharyngeal swab specimens and should not be used as a sole basis for treatment. Nasal washings and aspirates are unacceptable for Xpert Xpress SARS-CoV-2/FLU/RSV testing.  Fact Sheet for Patients: EntrepreneurPulse.com.au  Fact Sheet for Healthcare Providers: IncredibleEmployment.be  This test is not yet approved or cleared by the [place] FDA and has been authorized for detection and/or diagnosis of SARS-CoV-2 by FDA under an Emergency Use Authorization (EUA). This EUA will remain in effect (meaning this test can be used)  for the duration of the COVID-19 declaration under Section 564(b)(1) of the Act, 21 U.S.C. section 360bbb-3(b)(1), unless the authorization is terminated or revoked.  Performed at [facility], [place] [address] [place], [place] [address]   Blood culture (routine x 2)     Status: None (Preliminary result)   Collection Time: 06/27/22  9:11 PM   Specimen: BLOOD  Result Value Ref Range Status   Specimen Description   Final    BLOOD RIGHT ANTECUBITAL Performed at [place] [address] [place], [place] [address]    Special Requests   Final    BOTTLES DRAWN AEROBIC AND ANAEROBIC Blood Culture results may not be optimal due to an inadequate volume of blood received in culture bottles   Culture   Final    NO GROWTH 2 DAYS Performed at [facility], [place] [address] [place] [address] [place], [place] [address]    Report Status PENDING  Incomplete  Blood culture (routine x 2)     Status: None (Preliminary result)   Collection Time: 06/27/22 10:17 PM   Specimen: BLOOD RIGHT FOREARM  Result Value Ref Range Status   Specimen Description   Final    BLOOD RIGHT FOREARM Performed at [place] [address], [place], [place] [address]    Special Requests   Final    BOTTLES DRAWN AEROBIC AND ANAEROBIC Blood Culture adequate volume Performed at [place] [address] [place], [place] [address]    Culture   Final    NO GROWTH 2 DAYS Performed at [place] [address], [place], [place] [address]    Report Status PENDING  Incomplete  Respiratory (~20 pathogens) panel by PCR     Status: None   Collection Time: 06/28/22  1:45 AM   Specimen: Nasopharyngeal Swab; Respiratory  Result Value Ref Range Status   Adenovirus NOT DETECTED NOT DETECTED Final   Coronavirus 229E NOT DETECTED NOT DETECTED Final    Comment: (NOTE) The Coronavirus on the Respiratory Panel, DOES NOT test for the novel  Coronavirus  (2019 nCoV)    Coronavirus HKU1 NOT DETECTED NOT DETECTED Final   Coronavirus NL63 NOT DETECTED NOT DETECTED Final   Coronavirus OC43 NOT DETECTED NOT DETECTED Final   Metapneumovirus NOT DETECTED NOT DETECTED Final   Rhinovirus / Enterovirus NOT DETECTED NOT DETECTED Final   Influenza A NOT DETECTED NOT DETECTED Final   Influenza B NOT DETECTED NOT DETECTED Final   Parainfluenza Virus 1 NOT DETECTED NOT DETECTED Final   Parainfluenza Virus 2 NOT DETECTED NOT DETECTED Final   Parainfluenza Virus 3 NOT DETECTED NOT DETECTED Final   Parainfluenza Virus 4 NOT DETECTED NOT DETECTED Final   Respiratory Syncytial Virus NOT DETECTED NOT DETECTED Final   Bordetella pertussis NOT DETECTED NOT DETECTED Final   Bordetella Parapertussis NOT DETECTED NOT DETECTED Final   Chlamydophila pneumoniae NOT DETECTED NOT DETECTED Final   Mycoplasma pneumoniae NOT DETECTED NOT DETECTED  Final    Comment: Performed at [facility], [place] [address], [place], [place] [address]  MRSA Next Gen by PCR, Nasal     Status: None   Collection Time: 06/28/22  2:34 AM   Specimen: Nasal Mucosa; Nasal Swab  Result Value Ref Range Status   MRSA by PCR Next Gen NOT DETECTED NOT DETECTED Final    Comment: (NOTE) The GeneXpert MRSA Assay (FDA approved for NASAL specimens only), is one component of a comprehensive MRSA colonization surveillance program. It is not intended to diagnose MRSA infection nor to guide or monitor treatment for MRSA infections. Test performance is not FDA approved in patients less than 71 years old. Performed at [facility], [place] [address], [place], [place] [address]      Labs:   Basic Metabolic Panel: Recent Labs  Lab 06/27/22 2108 06/28/22 0631 06/28/22 [id number] 06/28/22 0648 06/29/22 0502 06/30/22 0457  NA 141  --  141 140 143 143  K 4.9  --  5.1 5.2* 4.7 4.8  CL 106  --  108 107 107 107  CO2 23  --  25 27 28 28   GLUCOSE 349*  --  335* 337* 286* 225*  BUN 36*   --  35* 36* 45* 46*  CREATININE 1.75*  --  1.62* 1.61* 1.61* 1.85*  CALCIUM 9.0  --  8.5* 8.6* 8.9 8.8*  MG  --  2.1  --   --   --   --   PHOS  --  4.2  --   --   --   --    Liver Function Tests: Recent Labs  Lab 06/28/22 0631 06/28/22 0632  AST 29 28  ALT 49* 50*  ALKPHOS 90 92  BILITOT 0.7 0.5  PROT 7.1 7.1  ALBUMIN 3.4* 3.4*    CBC: Recent Labs  Lab 06/27/22 2108 06/28/22 0631 06/29/22 0502 06/30/22 0457  WBC 13.1* 13.3* 15.4* 15.5*  NEUTROABS  --  11.8*  --   --   HGB 14.0 13.1 13.3 13.0  HCT 43.3 41.0 42.7 41.3  MCV 101.4* 102.2* 104.1* 103.0*  PLT 249 228 272 250   Cardiac Enzymes: Recent Labs  Lab 06/28/22 0631  CKTOTAL 110   BNP: BNP (last 3 results) Recent Labs    06/27/22 2108  BNP 194.9*     CBG: Recent Labs  Lab 06/29/22 1128 06/29/22 1620 06/29/22 2044 06/30/22 0724 06/30/22 1120  GLUCAP 263* 277* 250* 202* 170*     IMAGING STUDIES ECHOCARDIOGRAM COMPLETE  Result Date: 06/28/2022    ECHOCARDIOGRAM REPORT   Patient Name:   Jeff White Date of Exam: 06/28/2022 Medical Rec #:  [id number]       Height:       70.0 in Accession #:    [id number]      Weight:       187.9 lb Date of Birth:  05/20/1946       BSA:          2.033 m Patient Age:    63 years        BP:           133/72 mmHg Patient Gender: M               HR:           85 bpm. Exam Location:  Inpatient Procedure: 2D Echo, Cardiac Doppler, Color Doppler and Intracardiac  Opacification Agent Indications:    Elevated troponin  History:        Patient has no prior history of Echocardiogram examinations.  Sonographer:    Eduard Roux Referring Phys: [id number] ANASTASSIA DOUTOVA  Sonographer Comments: Technically difficult due to respiratory interference. IMPRESSIONS  1. Very limited study due to poor visualization despite use of definity contrast  2. Frequent PVCs throughout study  3. Left ventricular ejection fraction difficult to assess due to very frequent ventricular ectopy and  poor visualization, but, appears to be ~40 to 45%. The left ventricle has mildly decreased function. Left ventricular endocardial border not optimally defined to evaluate regional wall motion but appear global based on limited views. Left ventricular diastolic parameters are consistent with Grade II diastolic dysfunction (pseudonormalization).  4. Right ventricular systolic function is normal. The right ventricular size is normal.  5. The mitral valve is grossly normal. Trivial mitral valve regurgitation. No evidence of mitral stenosis.  6. The aortic valve was not well visualized. Aortic valve regurgitation is not visualized. Aortic valve sclerosis/calcification is present, without any evidence of aortic stenosis.  7. The inferior vena cava is normal in size with greater than 50% respiratory variability, suggesting right atrial pressure of 3 mmHg. Comparison(s): No prior Echocardiogram. FINDINGS  Left Ventricle: Left ventricular ejection fraction, by estimation, is 40 to 45%. The left ventricle has mildly decreased function. Left ventricular endocardial border not optimally defined to evaluate regional wall motion. The left ventricular internal cavity size was normal in size. There is no left ventricular hypertrophy. Left ventricular diastolic parameters are consistent with Grade II diastolic dysfunction (pseudonormalization). Right Ventricle: The right ventricular size is normal. No increase in right ventricular wall thickness. Right ventricular systolic function is normal. Left Atrium: Left atrial size was normal in size. Right Atrium: Right atrial size was normal in size. Pericardium: There is no evidence of pericardial effusion. Mitral Valve: The mitral valve is grossly normal. Trivial mitral valve regurgitation. No evidence of mitral valve stenosis. Tricuspid Valve: The tricuspid valve is normal in structure. Tricuspid valve regurgitation is not demonstrated. Aortic Valve: The aortic valve was not well  visualized. Aortic valve regurgitation is not visualized. Aortic valve sclerosis/calcification is present, without any evidence of aortic stenosis. Aortic valve peak gradient measures 5.9 mmHg. Pulmonic Valve: The pulmonic valve was not well visualized. Aorta: The aortic root and ascending aorta are structurally normal, with no evidence of dilitation. Venous: The inferior vena cava is normal in size with greater than 50% respiratory variability, suggesting right atrial pressure of 3 mmHg. IAS/Shunts: The atrial septum is grossly normal.  LEFT VENTRICLE PLAX 2D LVIDd:         5.00 cm LVIDs:         4.40 cm LV PW:         1.10 cm LV IVS:        1.00 cm LVOT diam:     2.20 cm LV SV:         84 LV SV Index:   42 LVOT Area:     3.80 cm  RIGHT VENTRICLE          IVC RV Basal diam:  3.00 cm  IVC diam: 1.50 cm LEFT ATRIUM             Index        RIGHT ATRIUM           Index LA diam:        4.10 cm 2.02 cm/m  RA Area:     16.30 cm LA Vol (A2C):   65.2 ml 32.08 ml/m  RA Volume:   43.50 ml  21.40 ml/m LA Vol (A4C):   50.8 ml 24.99 ml/m LA Biplane Vol: 58.0 ml 28.53 ml/m  AORTIC VALVE AV Area (Vmax): 3.49 cm AV Vmax:        121.00 cm/s AV Peak Grad:   5.9 mmHg LVOT Vmax:      111.00 cm/s LVOT Vmean:     71.350 cm/s LVOT VTI:       0.222 m  AORTA Ao Root diam: 3.30 cm Ao Asc diam:  3.20 cm  SHUNTS Systemic VTI:  0.22 m Systemic Diam: 2.20 cm Gwyndolyn Kaufman MD Electronically signed by Gwyndolyn Kaufman MD Signature Date/Time: 06/28/2022/2:51:19 PM    Final    CT Angio Chest PE W and/or Wo Contrast  Result Date: 06/27/2022 CLINICAL DATA:  Shortness of breath for several days with fevers EXAM: CT ANGIOGRAPHY CHEST WITH CONTRAST TECHNIQUE: Multidetector CT imaging of the chest was performed using the standard protocol during bolus administration of intravenous contrast. Multiplanar CT image reconstructions and MIPs were obtained to evaluate the vascular anatomy. RADIATION DOSE REDUCTION: This exam was performed  according to the departmental dose-optimization program which includes automated exposure control, adjustment of the mA and/or kV according to patient size and/or use of iterative reconstruction technique. CONTRAST:  50mL OMNIPAQUE IOHEXOL 350 MG/ML SOLN COMPARISON:  Chest x-ray from earlier in the same day. FINDINGS: Cardiovascular: Thoracic aorta shows atherosclerotic calcifications. No aneurysmal dilatation or dissection is noted. No cardiac enlargement is seen. Coronary calcifications are noted. The pulmonary artery shows a normal branching pattern bilaterally. No filling defect to suggest pulmonary embolism is noted. Mediastinum/Nodes: Thoracic inlet is within normal limits. No hilar or mediastinal adenopathy is noted. Scattered small mediastinal nodes are seen likely reactive in nature. The esophagus is within normal limits. Lungs/Pleura: Somewhat limited by patient motion artifact. Diffuse tree-in-bud nodularity is noted throughout both lungs consistent with inflammatory change. No sizable effusion is noted. No sizable parenchymal nodules are seen. Upper Abdomen: Upper pole left renal cyst is noted which appears simple in nature. No further follow-up is recommended. Musculoskeletal: Degenerative changes of the thoracic spine are noted. Review of the MIP images confirms the above findings. IMPRESSION: Diffuse changes throughout both lungs consistent with underlying inflammatory change likely atypical pneumonia. No evidence of pulmonary emboli. Aortic Atherosclerosis (ICD10-I70.0). Electronically Signed   By: Inez Catalina M.D.   On: 06/27/2022 23:47   DG Chest 2 View  Result Date: 06/27/2022 CLINICAL DATA:  Shortness of breath EXAM: CHEST - 2 VIEW COMPARISON:  Radiographs 10/18/2010 FINDINGS: Mild bronchovascular crowding in the lung bases with bronchial wall thickening. No focal consolidation, pleural effusion, or pneumothorax. Normal cardiomediastinal silhouette. No acute osseous abnormality. IMPRESSION:  Mild bronchitis.  No focal consolidation. Electronically Signed   By: Placido Sou M.D.   On: 06/27/2022 21:43    DISCHARGE EXAMINATION: Vitals:   06/29/22 2046 06/30/22 0518 06/30/22 0736 06/30/22 1224  BP: 121/71 (!) 149/89  (!) 143/74  Pulse: 60 67  69  Resp:  18  20  Temp: 97.7 F (36.5 C) 97.6 F (36.4 C)  97.8 F (36.6 C)  TempSrc: Oral   Oral  SpO2: 98% 95% 97% (!) 6%  Weight:      Height:       General appearance: Awake alert.  In no distress Resp: Normal effort at rest.  Improved air entry bilaterally.  Few crackles  at the bases.  No rhonchi.  No wheezing. Cardio: S1-S2 is normal regular.  No S3-S4.  No rubs murmurs or bruit GI: Abdomen is soft.  Nontender nondistended.  Bowel sounds are present normal.  No masses organomegaly    DISPOSITION: Self-care  Discharge Instructions     (HEART FAILURE PATIENTS) Call MD:  Anytime you have any of the following symptoms: 1) 3 pound weight gain in 24 hours or 5 pounds in 1 week 2) shortness of breath, with or without a dry hacking cough 3) swelling in the hands, feet or stomach 4) if you have to sleep on extra pillows at night in order to breathe.   Complete by: As directed    Call MD for:  difficulty breathing, headache or visual disturbances   Complete by: As directed    Call MD for:  extreme fatigue   Complete by: As directed    Call MD for:  persistant dizziness or light-headedness   Complete by: As directed    Call MD for:  persistant nausea and vomiting   Complete by: As directed    Call MD for:  severe uncontrolled pain   Complete by: As directed    Call MD for:  temperature >100.4   Complete by: As directed    Diet Carb Modified   Complete by: As directed    Discharge instructions   Complete by: As directed    Please be sure to follow-up with your primary care provider within the next few days after you return to [place].  Please also make an appointment to see your cardiologist in the next 1 to 2 weeks.   The ultrasound of the heart showed that your cardiac function is less than normal.  Please take your medications as prescribed.   You will need to wear your oxygen around-the-clock for now.  Your doctor will instruct you further depending on your oxygen saturation levels at follow-up.  Please stop smoking cigarettes.  You were cared for by a hospitalist during your hospital stay. If you have any questions about your discharge medications or the care you received while you were in the hospital after you are discharged, you can call the unit and asked to speak with the hospitalist on call if the hospitalist that took care of you is not available. Once you are discharged, your primary care physician will handle any further medical issues. Please note that NO REFILLS for any discharge medications will be authorized once you are discharged, as it is imperative that you return to your primary care physician (or establish a relationship with a primary care physician if you do not have one) for your aftercare needs so that they can reassess your need for medications and monitor your lab values. If you do not have a primary care physician, you can call [phone] for a physician referral.   Increase activity slowly   Complete by: As directed           Allergies as of 06/30/2022       Reactions   Codeine Other (See Comments)   "Makes me feel bad"        Medication List     STOP taking these medications    lisinopril 5 MG tablet Commonly known as: ZESTRIL       TAKE these medications    acetaminophen 650 MG CR tablet Commonly known as: TYLENOL Take 650 mg by mouth 2 (two) times daily as needed for pain. What changed: Another  medication with the same name was removed. Continue taking this medication, and follow the directions you see here.   albuterol 108 (90 Base) MCG/ACT inhaler Commonly known as: VENTOLIN HFA Inhale 1 puff into the lungs 3 (three) times daily as needed for wheezing or  shortness of breath.   Anoro Ellipta 62.5-25 MCG/ACT Aepb Generic drug: umeclidinium-vilanterol Inhale 1 puff into the lungs daily.   aspirin EC 81 MG tablet Take 81 mg by mouth daily. Swallow whole.   atorvastatin 20 MG tablet Commonly known as: LIPITOR Take 20 mg by mouth at bedtime.   azithromycin 500 MG tablet Commonly known as: ZITHROMAX Take 1 tablet (500 mg total) by mouth daily for 3 days.   carvedilol 3.125 MG tablet Commonly known as: COREG Take 1 tablet (3.125 mg total) by mouth 2 (two) times daily with a meal.   cefdinir 300 MG capsule Commonly known as: OMNICEF Take 1 capsule (300 mg total) by mouth every 12 (twelve) hours for 4 days.   diphenhydrAMINE 25 MG tablet Commonly known as: BENADRYL Take 25 mg by mouth daily as needed for allergies.   Eliquis 5 MG Tabs tablet Generic drug: apixaban Take 5 mg by mouth 2 (two) times daily.   Fish Oil 1000 MG Caps Take 1,000 mg by mouth daily.   guaiFENesin 600 MG 12 hr tablet Commonly known as: MUCINEX Take 1 tablet (600 mg total) by mouth 2 (two) times daily.   loperamide 2 MG tablet Commonly known as: IMODIUM A-D Take 2 mg by mouth daily as needed for diarrhea or loose stools.   metFORMIN 500 MG tablet Commonly known as: GLUCOPHAGE Take 500 mg by mouth daily.   nicotine 14 mg/24hr patch Commonly known as: NICODERM CQ - dosed in mg/24 hours Place 1 patch (14 mg total) onto the skin daily. Start taking on: July 01, 2022   omeprazole 20 MG capsule Commonly known as: PRILOSEC Take 20 mg by mouth daily as needed (reflux).   predniSONE 20 MG tablet Commonly known as: DELTASONE Take 2 tablets once daily for 3 days followed by 1 tablet once daily for 3 days and then stop Start taking on: July 01, 2022               Durable Medical Equipment  (From admission, onward)           Start     Ordered   06/30/22 0926  For home use only DME oxygen  Once       Question Answer Comment  Length of  Need Lifetime   Mode or (Route) Nasal cannula   Liters per Minute 2   Oxygen delivery system Gas      06/30/22 0925              Follow-up Information     Drucilla Chalet, MD. Schedule an appointment as soon as possible for a visit in 1 week(s).   Specialty: Family Medicine Why: post hospitalization follow up Contact information: [place] [id number] [facility].   Why: Home oxygen Contact information: [place] [address] [phone]                 TOTAL DISCHARGE TIME: 35 minutes  [place] Blucher [employer] on [email or web address]  06/30/2022, 12:32 PM

## 2022-07-03 LAB — CULTURE, BLOOD (ROUTINE X 2)
Culture: NO GROWTH
Culture: NO GROWTH
Special Requests: ADEQUATE
